# Patient Record
Sex: Female | Born: 1995 | Race: Black or African American | Hispanic: No | Smoking: Never smoker
Health system: Southern US, Community
[De-identification: ages and names within clinical notes are randomized; demographics above are authoritative.]

## PROBLEM LIST (undated history)

## (undated) HISTORY — PX: WISDOM TOOTH EXTRACTION: SHX21

---

## 2016-01-26 ENCOUNTER — Encounter (HOSPITAL_COMMUNITY): Payer: Self-pay

## 2016-01-26 ENCOUNTER — Ambulatory Visit (HOSPITAL_COMMUNITY)
Admission: EM | Admit: 2016-01-26 | Discharge: 2016-01-26 | Disposition: A | Discharge status: Home / Self Care | Payer: Medicaid Other | Attending: Family Medicine | Admitting: Family Medicine

## 2016-01-26 DIAGNOSIS — B349 Viral infection, unspecified: Secondary | ICD-10-CM

## 2016-01-26 DIAGNOSIS — J069 Acute upper respiratory infection, unspecified: Secondary | ICD-10-CM

## 2016-01-26 DIAGNOSIS — B9789 Other viral agents as the cause of diseases classified elsewhere: Secondary | ICD-10-CM | POA: Diagnosis not present

## 2016-01-26 NOTE — Discharge Instructions (Signed)
Sudafed PE 10 mg every 4 to 6 hours as needed for congestion °Allegra or Zyrtec daily as needed for drainage and runny nose. °For stronger antihistamine may take Chlor-Trimeton 2 to 4 mg every 4 to 6 hours, may cause drowsiness. °Saline nasal spray used frequently. °Ibuprofen 600 mg every 6 hours as needed for pain, discomfort or fever. °Drink plenty of fluids and stay well-hydrated. °

## 2016-01-26 NOTE — ED Triage Notes (Addendum)
[facility] for [facility] flu, symptoms consist of body aches, loss of appetite, chills, and runny nose x4 days, pt has been taking Thera-Flu, Vicks vapor rub, and dayquil, no relief

## 2016-01-26 NOTE — ED Provider Notes (Signed)
CSN: [id number]     Arrival date & time 01/26/16  1507 History   First MD Initiated Contact with Patient 01/26/16 1716     Chief Complaint  Patient presents with  . Influenza   (Consider location/radiation/quality/duration/timing/severity/associated sxs/prior Treatment) 21 year old female lives in a dorm at school complaining of a four-day history of runny nose, cough, subjective fever, body aches, chills, transient mild nausea and headache. She has been taking various OTC medications and has no relief. She is requesting a flu test. She is currently afebrile with temperature 98.7, temp 105 and vital signs otherwise normal.      History reviewed. No pertinent past medical history. History reviewed. No pertinent surgical history. History reviewed. No pertinent family history. Social History  Substance Use Topics  . Smoking status: Never Smoker  . Smokeless tobacco: Never Used  . Alcohol use No   OB History    Gravida Para Term Preterm AB Living   1             SAB TAB Ectopic Multiple Live Births                 Review of Systems  Constitutional: Positive for activity change, appetite change, fatigue and fever. Negative for chills.  HENT: Positive for congestion, postnasal drip and rhinorrhea. Negative for facial swelling, sore throat and trouble swallowing.   Eyes: Negative.   Respiratory: Positive for cough. Negative for shortness of breath.   Cardiovascular: Negative.   Gastrointestinal: Negative.   Musculoskeletal: Negative for neck pain and neck stiffness.  Skin: Negative for pallor and rash.  Neurological: Negative.     Allergies  Patient has no known allergies.  Home Medications   Prior to Admission medications   Not on File   Meds Ordered and Administered this Visit  Medications - No data to display  BP 116/78 (BP Location: Right Arm)   Pulse 105   Temp 98.7 F (37.1 C) (Oral)   Resp 18   LMP 01/07/2016 (Exact Date)   SpO2 98%  No data  found.   Physical Exam  Constitutional: She is oriented to person, place, and time. She appears well-developed and well-nourished. No distress.  HENT:  Head: Normocephalic and atraumatic.  Right Ear: External ear normal.  Left Ear: External ear normal.  Mouth/Throat: No oropharyngeal exudate.  Oropharynx with minor injection and clear PND otherwise normal. No exudate. Left TM with retraction. Right TM normal.   Eyes: EOM are normal. Pupils are equal, round, and reactive to light.  Neck: Normal range of motion. Neck supple.  Cardiovascular: Normal rate, regular rhythm, normal heart sounds and intact distal pulses.   Pulmonary/Chest: Effort normal and breath sounds normal. No respiratory distress. She has no wheezes. She has no rales.  Abdominal: Soft. There is no tenderness.  Musculoskeletal: Normal range of motion. She exhibits no edema.  Lymphadenopathy:    She has no cervical adenopathy.  Neurological: She is alert and oriented to person, place, and time.  Skin: Skin is warm and dry.  Psychiatric: She has a normal mood and affect.  Nursing note and vitals reviewed.   Urgent Care Course     Procedures (including critical care time)  Labs Review Labs Reviewed - No data to display  Imaging Review No results found.   Visual Acuity Review  Right Eye Distance:   Left Eye Distance:   Bilateral Distance:    Right Eye Near:   Left Eye Near:    Bilateral Near:  MDM   1. Viral illness   2. Viral upper respiratory tract infection    Sudafed PE 10 mg every 4 to 6 hours as needed for congestion Allegra or Zyrtec daily as needed for drainage and runny nose. For stronger antihistamine may take Chlor-Trimeton 2 to 4 mg every 4 to 6 hours, may cause drowsiness. Saline nasal spray used frequently. Ibuprofen 600 mg every 6 hours as needed for pain, discomfort or fever. Drink plenty of fluids and stay well-hydrated.    Hayden Rasmussenavid Courney Garrod, NP 01/26/16 1740

## 2017-01-08 DIAGNOSIS — R51 Headache: Secondary | ICD-10-CM | POA: Diagnosis not present

## 2017-01-13 DIAGNOSIS — K080 Exfoliation of teeth due to systemic causes: Secondary | ICD-10-CM | POA: Diagnosis not present

## 2017-01-14 DIAGNOSIS — K080 Exfoliation of teeth due to systemic causes: Secondary | ICD-10-CM | POA: Diagnosis not present

## 2017-02-22 ENCOUNTER — Other Ambulatory Visit: Payer: Self-pay

## 2017-02-22 ENCOUNTER — Ambulatory Visit (HOSPITAL_COMMUNITY)
Admission: EM | Admit: 2017-02-22 | Discharge: 2017-02-22 | Disposition: A | Discharge status: Home / Self Care | Payer: Medicaid Other | Attending: Internal Medicine | Admitting: Internal Medicine

## 2017-02-22 ENCOUNTER — Encounter (HOSPITAL_COMMUNITY): Payer: Self-pay | Admitting: *Deleted

## 2017-02-22 DIAGNOSIS — B349 Viral infection, unspecified: Secondary | ICD-10-CM | POA: Diagnosis not present

## 2017-02-22 MED ORDER — IPRATROPIUM BROMIDE 0.06 % NA SOLN
2.0000 | Freq: Four times a day (QID) | NASAL | 0 refills | Status: DC
Start: 2017-02-22 — End: 2018-09-22

## 2017-02-22 MED ORDER — PREDNISONE 50 MG PO TABS: 50.0000 mg | ORAL_TABLET | Freq: Every day | ORAL | 0 refills | Status: AC

## 2017-02-22 MED ORDER — FLUTICASONE PROPIONATE 50 MCG/ACT NA SUSP: 2.0000 | Freq: Every day | NASAL | 0 refills | Status: DC

## 2017-02-22 MED ORDER — OLOPATADINE HCL 0.2 % OP SOLN: 1.0000 [drp] | Freq: Every day | OPHTHALMIC | 0 refills | Status: DC

## 2017-02-22 NOTE — ED Triage Notes (Signed)
Chills, nasal congestion, facial pain, ear and headaches on right side.

## 2017-02-22 NOTE — ED Provider Notes (Signed)
[facility]    CSN: [id number] Arrival date & time: 02/22/17  1343     History   Chief Complaint Chief Complaint  Patient presents with  . Nasal Congestion  . Facial Pain  . Headache    right side  . Otalgia    right side    HPI Samantha Foster is a 22 y.o. female.   22 year old female comes in for 4-day history of URI symptoms.  Has had rhinorrhea, nasal congestion, throat irritation, facial pain, your pain, frontal headaches.  She is also had continued eye watering without itchiness, eye redness, sneezing.  Denies cough.  Subjective fever.  OTC cold medication without relief.  No obvious sick contact.  Never smoker.      History reviewed. No pertinent past medical history.  There are no active problems to display for this patient.   History reviewed. No pertinent surgical history.  OB History    Gravida Para Term Preterm AB Living   1             SAB TAB Ectopic Multiple Live Births                   Home Medications    Prior to Admission medications   Medication Sig Start Date End Date Taking? Authorizing Provider  guaifenesin (ROBITUSSIN) 100 MG/5ML syrup Take 200 mg by mouth 3 (three) times daily as needed for cough.   Yes [provider]  pseudoephedrine (SUDAFED) 30 MG tablet Take 30 mg by mouth every 4 (four) hours as needed for congestion.   Yes [provider]  fluticasone (FLONASE) 50 MCG/ACT nasal spray Place 2 sprays into both nostrils daily. 02/22/17   Cathie HoopsYu, Amy V, PA-C  ipratropium (ATROVENT) 0.06 % nasal spray Place 2 sprays into both nostrils 4 (four) times daily. 02/22/17   Cathie HoopsYu, Amy V, PA-C  Olopatadine HCl 0.2 % SOLN Apply 1 drop to eye daily. 02/22/17   Cathie HoopsYu, Amy V, PA-C  predniSONE (DELTASONE) 50 MG tablet Take 1 tablet (50 mg total) by mouth daily for 3 days. 02/22/17 02/25/17  Belinda FisherYu, Amy V, PA-C    Family History History reviewed. No pertinent family history.  Social History Social History   Tobacco Use  .  Smoking status: Never Smoker  . Smokeless tobacco: Never Used  Substance Use Topics  . Alcohol use: No  . Drug use: No     Allergies   Patient has no known allergies.   Review of Systems Review of Systems  Reason unable to perform ROS: See HPI as above.     Physical Exam Triage Vital Signs ED Triage Vitals  Enc Vitals Group     BP 02/22/17 1536 121/82     Pulse Rate 02/22/17 1536 93     Resp --      Temp 02/22/17 1536 98.4 F (36.9 C)     Temp Source 02/22/17 1536 Oral     SpO2 02/22/17 1536 100 %     Weight --      Height --      Head Circumference --      Peak Flow --      Pain Score 02/22/17 1538 8     Pain Loc --      Pain Edu? --      Excl. in GC? --    No data found.  Updated Vital Signs BP 121/82 (BP Location: Left Arm)   Pulse 93   Temp 98.4 F (36.9  C) (Oral)   LMP 02/19/2017 (Exact Date)   SpO2 100%   Breastfeeding? No   Physical Exam  Constitutional: She is oriented to person, place, and time. She appears well-developed and well-nourished. No distress.  HENT:  Head: Normocephalic and atraumatic.  Right Ear: External ear and ear canal normal. Tympanic membrane is erythematous. Tympanic membrane is not bulging.  Left Ear: External ear and ear canal normal. Tympanic membrane is erythematous. Tympanic membrane is not bulging.  Nose: Mucosal edema and rhinorrhea present. Right sinus exhibits maxillary sinus tenderness and frontal sinus tenderness. Left sinus exhibits maxillary sinus tenderness and frontal sinus tenderness.  Mouth/Throat: Uvula is midline, oropharynx is clear and moist and mucous membranes are normal.  Eyes: Conjunctivae are normal. Pupils are equal, round, and reactive to light.  Neck: Normal range of motion. Neck supple.  Cardiovascular: Normal rate, regular rhythm and normal heart sounds. Exam reveals no gallop and no friction rub.  No murmur heard. Pulmonary/Chest: Effort normal and breath sounds normal. She has no decreased  breath sounds. She has no wheezes. She has no rhonchi. She has no rales.  Lymphadenopathy:    She has no cervical adenopathy.  Neurological: She is alert and oriented to person, place, and time.  Skin: Skin is warm and dry.  Psychiatric: She has a normal mood and affect. Her behavior is normal. Judgment normal.     UC Treatments / Results  Labs (all labs ordered are listed, but only abnormal results are displayed) Labs Reviewed - No data to display  EKG  EKG Interpretation None       Radiology No results found.  Procedures Procedures (including critical care time)  Medications Ordered in UC Medications - No data to display   Initial Impression / Assessment and Plan / UC Course  I have reviewed the triage vital signs and the nursing notes.  Pertinent labs & imaging results that were available during my care of the patient were reviewed by me and considered in my medical decision making (see chart for details).    Discussed with patient history and exam most consistent with viral URI. Symptomatic treatment as needed. Push fluids. Return precautions given.   Final Clinical Impressions(s) / UC Diagnoses   Final diagnoses:  Viral illness    ED Discharge Orders        Ordered    fluticasone (FLONASE) 50 MCG/ACT nasal spray  Daily     02/22/17 1619    ipratropium (ATROVENT) 0.06 % nasal spray  4 times daily     02/22/17 1619    predniSONE (DELTASONE) 50 MG tablet  Daily     02/22/17 1619    Olopatadine HCl 0.2 % SOLN  Daily     02/22/17 1619        Belinda Fisher, PA-C 02/22/17 1830

## 2017-02-22 NOTE — Discharge Instructions (Signed)
Prednisone as directed for sinus pressure.  Start flonase, atrovent nasal spray for nasal congestion/drainage.  Continue Sudafed. you can use over the counter nasal saline rinse such as neti pot for nasal congestion. Keep hydrated, your urine should be clear to pale yellow in color.  You can take Pataday eyedrop to help with eye watering. Tylenol/motrin for fever and pain. Monitor for any worsening of symptoms, chest pain, shortness of breath, wheezing, swelling of the throat, follow up for reevaluation.   For sore throat try using a honey-based tea. Use 3 teaspoons of honey with juice squeezed from half lemon. Place shaved pieces of ginger into 1/2-1 cup of water and warm over stove top. Then mix the ingredients and repeat every 4 hours as needed.

## 2017-04-22 DIAGNOSIS — Z1159 Encounter for screening for other viral diseases: Secondary | ICD-10-CM | POA: Diagnosis not present

## 2017-04-22 DIAGNOSIS — Z113 Encounter for screening for infections with a predominantly sexual mode of transmission: Secondary | ICD-10-CM | POA: Diagnosis not present

## 2017-04-22 DIAGNOSIS — N762 Acute vulvitis: Secondary | ICD-10-CM | POA: Diagnosis not present

## 2017-04-28 DIAGNOSIS — N762 Acute vulvitis: Secondary | ICD-10-CM | POA: Diagnosis not present

## 2017-04-28 DIAGNOSIS — Z113 Encounter for screening for infections with a predominantly sexual mode of transmission: Secondary | ICD-10-CM | POA: Diagnosis not present

## 2017-05-27 DIAGNOSIS — B373 Candidiasis of vulva and vagina: Secondary | ICD-10-CM | POA: Diagnosis not present

## 2017-05-27 DIAGNOSIS — Z114 Encounter for screening for human immunodeficiency virus [HIV]: Secondary | ICD-10-CM | POA: Diagnosis not present

## 2017-05-27 DIAGNOSIS — N760 Acute vaginitis: Secondary | ICD-10-CM | POA: Diagnosis not present

## 2017-05-27 DIAGNOSIS — Z113 Encounter for screening for infections with a predominantly sexual mode of transmission: Secondary | ICD-10-CM | POA: Diagnosis not present

## 2017-06-11 DIAGNOSIS — K080 Exfoliation of teeth due to systemic causes: Secondary | ICD-10-CM | POA: Diagnosis not present

## 2017-07-01 DIAGNOSIS — K080 Exfoliation of teeth due to systemic causes: Secondary | ICD-10-CM | POA: Diagnosis not present

## 2017-07-28 DIAGNOSIS — K080 Exfoliation of teeth due to systemic causes: Secondary | ICD-10-CM | POA: Diagnosis not present

## 2017-08-07 DIAGNOSIS — K080 Exfoliation of teeth due to systemic causes: Secondary | ICD-10-CM | POA: Diagnosis not present

## 2017-08-13 DIAGNOSIS — K080 Exfoliation of teeth due to systemic causes: Secondary | ICD-10-CM | POA: Diagnosis not present

## 2017-11-25 DIAGNOSIS — B373 Candidiasis of vulva and vagina: Secondary | ICD-10-CM | POA: Diagnosis not present

## 2017-11-25 DIAGNOSIS — N760 Acute vaginitis: Secondary | ICD-10-CM | POA: Diagnosis not present

## 2018-03-16 ENCOUNTER — Encounter (HOSPITAL_COMMUNITY): Payer: Self-pay

## 2018-03-16 ENCOUNTER — Ambulatory Visit (HOSPITAL_COMMUNITY)
Admission: EM | Admit: 2018-03-16 | Discharge: 2018-03-16 | Disposition: A | Discharge status: Home / Self Care | Payer: Federal, State, Local not specified - PPO | Attending: Family Medicine | Admitting: Family Medicine

## 2018-03-16 DIAGNOSIS — K625 Hemorrhage of anus and rectum: Secondary | ICD-10-CM | POA: Diagnosis not present

## 2018-03-16 LAB — CBC WITH DIFFERENTIAL/PLATELET
Abs Immature Granulocytes: 0.03 10*3/uL (ref 0.00–0.07)
BASOS ABS: 0 10*3/uL (ref 0.0–0.1)
BASOS PCT: 0 %
EOS ABS: 0 10*3/uL (ref 0.0–0.5)
Eosinophils Relative: 0 %
HCT: 39.1 % (ref 36.0–46.0)
Hemoglobin: 13.3 g/dL (ref 12.0–15.0)
IMMATURE GRANULOCYTES: 0 %
Lymphocytes Relative: 29 %
Lymphs Abs: 2.1 10*3/uL (ref 0.7–4.0)
MCH: 29.2 pg (ref 26.0–34.0)
MCHC: 34 g/dL (ref 30.0–36.0)
MCV: 85.7 fL (ref 80.0–100.0)
Monocytes Absolute: 0.6 10*3/uL (ref 0.1–1.0)
Monocytes Relative: 9 %
NEUTROS PCT: 62 %
NRBC: 0 % (ref 0.0–0.2)
Neutro Abs: 4.5 10*3/uL (ref 1.7–7.7)
PLATELETS: 260 10*3/uL (ref 150–400)
RBC: 4.56 MIL/uL (ref 3.87–5.11)
RDW: 12.6 % (ref 11.5–15.5)
WBC: 7.2 10*3/uL (ref 4.0–10.5)

## 2018-03-16 LAB — OCCULT BLOOD, POC DEVICE: Fecal Occult Bld: NEGATIVE

## 2018-03-16 MED ORDER — ONDANSETRON HCL 4 MG PO TABS: 4.0000 mg | ORAL_TABLET | Freq: Four times a day (QID) | ORAL | 0 refills | Status: DC

## 2018-03-16 NOTE — ED Triage Notes (Signed)
Pt states she thinks she has food poisoning  from a brownie yesterday. C/o nausea and diarrhea

## 2018-03-16 NOTE — Discharge Instructions (Signed)
I will call you with the results from today  Please monitor your continued bowel movements  Please try to stay well hydrated.  Please seek immediate care if you have significant pain or develop fever.

## 2018-03-16 NOTE — ED Provider Notes (Signed)
[facility]    CSN: [id number] Arrival date & time: 03/16/18  1846     History   Chief Complaint Chief Complaint  Patient presents with  . Nausea    HPI Samantha Foster is a 23 y.o. female.   She is presenting with bright red blood per rectum that is painless.  This is been occurring since last night.  She has had about 3 movements.  The blood is a significant amount.  She denies any history of trauma.  No history of inflammatory bowel disease.  Denies any recent travel.  No fevers.  No history of similar symptoms.  Has not taken anything for this.  Has some nausea and lightheadedness associated with it.  HPI  History reviewed. No pertinent past medical history.  There are no active problems to display for this patient.   History reviewed. No pertinent surgical history.  OB History    Gravida  1   Para      Term      Preterm      AB      Living        SAB      TAB      Ectopic      Multiple      Live Births               Home Medications    Prior to Admission medications   Medication Sig Start Date End Date Taking? Authorizing Provider  fluticasone (FLONASE) 50 MCG/ACT nasal spray Place 2 sprays into both nostrils daily. 02/22/17   Cathie Hoops, Amy V, PA-C  guaifenesin (ROBITUSSIN) 100 MG/5ML syrup Take 200 mg by mouth 3 (three) times daily as needed for cough.    [provider]  ipratropium (ATROVENT) 0.06 % nasal spray Place 2 sprays into both nostrils 4 (four) times daily. 02/22/17   Cathie Hoops, Amy V, PA-C  Olopatadine HCl 0.2 % SOLN Apply 1 drop to eye daily. 02/22/17   Cathie Hoops, Amy V, PA-C  ondansetron (ZOFRAN) 4 MG tablet Take 1 tablet (4 mg total) by mouth every 6 (six) hours. 03/16/18   Myra Rude, MD  pseudoephedrine (SUDAFED) 30 MG tablet Take 30 mg by mouth every 4 (four) hours as needed for congestion.    [provider]    Family History No family history on file.  Social History Social History   Tobacco Use  .  Smoking status: Never Smoker  . Smokeless tobacco: Never Used  Substance Use Topics  . Alcohol use: No  . Drug use: No     Allergies   Patient has no known allergies.   Review of Systems Review of Systems  Constitutional: Negative for fever.  HENT: Negative for congestion.   Respiratory: Negative for cough.   Cardiovascular: Negative for chest pain.  Gastrointestinal: Positive for abdominal pain and nausea.  Musculoskeletal: Negative for gait problem.  Skin: Negative for color change.  Neurological: Negative for weakness.  Hematological: Negative for adenopathy.  Psychiatric/Behavioral: Negative for agitation.     Physical Exam Triage Vital Signs ED Triage Vitals  Enc Vitals Group     BP 03/16/18 1901 115/76     Pulse Rate 03/16/18 1901 93     Resp 03/16/18 1901 18     Temp 03/16/18 1901 98.4 F (36.9 C)     Temp Source 03/16/18 1901 Temporal     SpO2 03/16/18 1901 99 %     Weight --  Height --      Head Circumference --      Peak Flow --      Pain Score 03/16/18 1902 8     Pain Loc --      Pain Edu? --      Excl. in GC? --    No data found.  Updated Vital Signs BP 115/76 (BP Location: Right Arm)   Pulse 93   Temp 98.4 F (36.9 C) (Temporal)   Resp 18   LMP 03/06/2018   SpO2 99%   Visual Acuity Right Eye Distance:   Left Eye Distance:   Bilateral Distance:    Right Eye Near:   Left Eye Near:    Bilateral Near:     Physical Exam Gen: NAD, alert, cooperative with exam, well-appearing ENT: normal lips, normal nasal mucosa,  Eye: normal EOM, normal conjunctiva and lids CV:  no edema, +2 pedal pulses   Resp: no accessory muscle use, non-labored,  GI: no masses or tenderness, no hernia, positive bowel sounds, nondistended, Rectum: No hernia appreciated, normal external genitalia, no internal hemorrhoids appreciated, normal sphincter tone Skin: no rashes, no areas of induration  Neuro: normal tone, normal sensation to touch Psych:  normal  insight, alert and oriented MSK: Normal gait, normal strength   UC Treatments / Results  Labs (all labs ordered are listed, but only abnormal results are displayed) Labs Reviewed  CBC WITH DIFFERENTIAL/PLATELET  OCCULT BLOOD, POC DEVICE    EKG None  Radiology No results found.  Procedures Procedures (including critical care time)  Medications Ordered in UC Medications - No data to display  Initial Impression / Assessment and Plan / UC Course  I have reviewed the triage vital signs and the nursing notes.  Pertinent labs & imaging results that were available during my care of the patient were reviewed by me and considered in my medical decision making (see chart for details).     [name] is a 23 year old female is presenting with bright red blood per rectum.  This is painless and does not have any fever.  Likely related to hemorrhoids.  Less likely for inflammatory bowel disease or infection.  Will obtain a CBC.  Stool guaiac test was negative.  Given indications to follow-up and ongoing care.  May need colonoscopy if continues and further stool studies.  Final Clinical Impressions(s) / UC Diagnoses   Final diagnoses:  Bright red blood per rectum     Discharge Instructions     I will call you with the results from today  Please monitor your continued bowel movements  Please try to stay well hydrated.  Please seek immediate care if you have significant pain or develop fever.     ED Prescriptions    Medication Sig Dispense Auth. Provider   ondansetron (ZOFRAN) 4 MG tablet Take 1 tablet (4 mg total) by mouth every 6 (six) hours. 12 tablet Myra Rude, MD     Controlled Substance Prescriptions [place] Controlled Substance Registry consulted? Not Applicable   Myra Rude, MD 03/16/18 2141

## 2018-03-23 ENCOUNTER — Ambulatory Visit (INDEPENDENT_AMBULATORY_CARE_PROVIDER_SITE_OTHER): Payer: Federal, State, Local not specified - PPO | Admitting: Family Medicine

## 2018-03-23 ENCOUNTER — Other Ambulatory Visit: Payer: Self-pay

## 2018-03-23 ENCOUNTER — Encounter: Payer: Self-pay | Admitting: Family Medicine

## 2018-03-23 VITALS — BP 110/74 | HR 60 | Temp 98.0°F | Ht 61.0 in | Wt 136.2 lb

## 2018-03-23 DIAGNOSIS — Z3202 Encounter for pregnancy test, result negative: Secondary | ICD-10-CM

## 2018-03-23 DIAGNOSIS — R110 Nausea: Secondary | ICD-10-CM | POA: Insufficient documentation

## 2018-03-23 DIAGNOSIS — Z7689 Persons encountering health services in other specified circumstances: Secondary | ICD-10-CM

## 2018-03-23 DIAGNOSIS — R1012 Left upper quadrant pain: Secondary | ICD-10-CM | POA: Insufficient documentation

## 2018-03-23 DIAGNOSIS — R1084 Generalized abdominal pain: Secondary | ICD-10-CM | POA: Diagnosis not present

## 2018-03-23 DIAGNOSIS — K5900 Constipation, unspecified: Secondary | ICD-10-CM | POA: Insufficient documentation

## 2018-03-23 DIAGNOSIS — Z8719 Personal history of other diseases of the digestive system: Secondary | ICD-10-CM | POA: Insufficient documentation

## 2018-03-23 DIAGNOSIS — R197 Diarrhea, unspecified: Secondary | ICD-10-CM | POA: Insufficient documentation

## 2018-03-23 DIAGNOSIS — Z0000 Encounter for general adult medical examination without abnormal findings: Secondary | ICD-10-CM

## 2018-03-23 DIAGNOSIS — Z09 Encounter for follow-up examination after completed treatment for conditions other than malignant neoplasm: Secondary | ICD-10-CM

## 2018-03-23 LAB — POCT GLYCOSYLATED HEMOGLOBIN (HGB A1C): Hemoglobin A1C: 5 % (ref 4.0–5.6)

## 2018-03-23 LAB — POCT URINALYSIS DIP (MANUAL ENTRY)
Blood, UA: NEGATIVE
Glucose, UA: NEGATIVE mg/dL
Leukocytes, UA: NEGATIVE
Nitrite, UA: NEGATIVE
Protein Ur, POC: 30 mg/dL — AB
Spec Grav, UA: >=1.03 — AB (ref 1.010–1.025)
Urobilinogen, UA: 1 E.U./dL
pH, UA: 6 (ref 5.0–8.0)

## 2018-03-23 LAB — POCT URINE PREGNANCY: Preg Test, Ur: NEGATIVE

## 2018-03-23 MED ORDER — ONDANSETRON HCL 4 MG PO TABS: 4.0000 mg | ORAL_TABLET | Freq: Four times a day (QID) | ORAL | 1 refills | Status: DC

## 2018-03-23 MED ORDER — POLYETHYLENE GLYCOL 3350 17 GM/SCOOP PO POWD: 17.0000 g | Freq: Every day | ORAL | 1 refills | Status: DC

## 2018-03-23 MED ORDER — DOCUSATE SODIUM 100 MG PO CAPS: 100.0000 mg | ORAL_CAPSULE | Freq: Two times a day (BID) | ORAL | 0 refills | Status: DC

## 2018-03-23 NOTE — Patient Instructions (Signed)
Diarrhea, Adult Diarrhea is when you pass loose and watery poop (stool) often. Diarrhea can make you feel weak and cause you to lose water in your body (get dehydrated). Losing water in your body can cause you to:  Feel tired and thirsty.  Have a dry mouth.  Go pee (urinate) less often. Diarrhea often lasts 2-3 days. However, it can last longer if it is a sign of something more serious. It is important to treat your diarrhea as told by your doctor. Follow these instructions at home: Eating and drinking     Follow these instructions as told by your doctor:  Take an ORS (oral rehydration solution). This is a drink that helps you replace fluids and minerals your body lost. It is sold at pharmacies and stores.  Drink plenty of fluids, such as: ? Water. ? Ice chips. ? Diluted fruit juice. ? Low-calorie sports drinks. ? Milk, if you want.  Avoid drinking fluids that have a lot of sugar or caffeine in them.  Eat bland, easy-to-digest foods in small amounts as you are able. These foods include: ? Bananas. ? Applesauce. ? Rice. ? Low-fat (lean) meats. ? Toast. ? Crackers.  Avoid alcohol.  Avoid spicy or fatty foods.  Medicines  Take over-the-counter and prescription medicines only as told by your doctor.  If you were prescribed an antibiotic medicine, take it as told by your doctor. Do not stop using the antibiotic even if you start to feel better. General instructions   Wash your hands often using soap and water. If soap and water are not available, use a hand sanitizer. Others in your home should wash their hands as well. Hands should be washed: ? After using the toilet or changing a diaper. ? Before preparing, cooking, or serving food. ? While caring for a sick person. ? While visiting someone in a hospital.  Drink enough fluid to keep your pee (urine) pale yellow.  Rest at home while you get better.  Watch your condition for any changes.  Take a warm bath to help  with any burning or pain from having diarrhea.  Keep all follow-up visits as told by your doctor. This is important. Contact a doctor if:  You have a fever.  Your diarrhea gets worse.  You have new symptoms.  You cannot keep fluids down.  You feel light-headed or dizzy.  You have a headache.  You have muscle cramps. Get help right away if:  You have chest pain.  You feel very weak or you pass out (faint).  You have bloody or black poop or poop that looks like tar.  You have very bad pain, cramping, or bloating in your belly (abdomen).  You have trouble breathing or you are breathing very quickly.  Your heart is beating very quickly.  Your skin feels cold and clammy.  You feel confused.  You have signs of losing too much water in your body, such as: ? Dark pee, very little pee, or no pee. ? Cracked lips. ? Dry mouth. ? Sunken eyes. ? Sleepiness. ? Weakness. Summary  Diarrhea is when you pass loose and watery poop (stool) often.  Diarrhea can make you feel weak and cause you to lose water in your body (get dehydrated).  Take an ORS (oral rehydration solution). This is a drink that is sold at pharmacies and stores.  Eat bland, easy-to-digest foods in small amounts as you are able.  Contact a doctor if your condition gets worse. Get help right   away if you have signs that you have lost too much water in your body. This information is not intended to replace advice given to you by your health care provider. Make sure you discuss any questions you have with your health care provider. Document Released: 06/11/2007 Document Revised: 05/29/2017 Document Reviewed: 05/29/2017 Elsevier Interactive Patient Education  2019 Elsevier Inc.  Ondansetron tablets What is this medicine? ONDANSETRON (on DAN se tron) is used to treat nausea and vomiting caused by chemotherapy. It is also used to prevent or treat nausea and vomiting after surgery. This medicine may be used for  other purposes; ask your health care provider or pharmacist if you have questions. COMMON BRAND NAME(S): Zofran What should I tell my health care provider before I take this medicine? They need to know if you have any of these conditions: -heart disease -history of irregular heartbeat -liver disease -low levels of magnesium or potassium in the blood -an unusual or allergic reaction to ondansetron, granisetron, other medicines, foods, dyes, or preservatives -pregnant or trying to get pregnant -breast-feeding How should I use this medicine? Take this medicine by mouth with a glass of water. Follow the directions on your prescription label. Take your doses at regular intervals. Do not take your medicine more often than directed. Talk to your pediatrician regarding the use of this medicine in children. Special care may be needed. Overdosage: If you think you have taken too much of this medicine contact a poison control center or emergency room at once. NOTE: This medicine is only for you. Do not share this medicine with others. What if I miss a dose? If you miss a dose, take it as soon as you can. If it is almost time for your next dose, take only that dose. Do not take double or extra doses. What may interact with this medicine? Do not take this medicine with any of the following medications: -apomorphine -certain medicines for fungal infections like fluconazole, itraconazole, ketoconazole, posaconazole, voriconazole -cisapride -dofetilide -dronedarone -pimozide -thioridazine -ziprasidone This medicine may also interact with the following medications: -carbamazepine -certain medicines for depression, anxiety, or psychotic disturbances -fentanyl -linezolid -MAOIs like Carbex, Eldepryl, Marplan, Nardil, and Parnate -methylene blue (injected into a vein) -other medicines that prolong the QT interval (cause an abnormal heart rhythm) -phenytoin -rifampicin -tramadol This list may not  describe all possible interactions. Give your health care provider a list of all the medicines, herbs, non-prescription drugs, or dietary supplements you use. Also tell them if you smoke, drink alcohol, or use illegal drugs. Some items may interact with your medicine. What should I watch for while using this medicine? Check with your doctor or health care professional right away if you have any sign of an allergic reaction. What side effects may I notice from receiving this medicine? Side effects that you should report to your doctor or health care professional as soon as possible: -allergic reactions like skin rash, itching or hives, swelling of the face, lips or tongue -breathing problems -confusion -dizziness -fast or irregular heartbeat -feeling faint or lightheaded, falls -fever and chills -loss of balance or coordination -seizures -sweating -swelling of the hands or feet -tightness in the chest -tremors -unusually weak or tired Side effects that usually do not require medical attention (report to your doctor or health care professional if they continue or are bothersome): -constipation or diarrhea -headache This list may not describe all possible side effects. Call your doctor for medical advice about side effects. You may report side effects  to FDA at 1-800-FDA-1088. Where should I keep my medicine? Keep out of the reach of children. Store between 2 and 30 degrees C (36 and 86 degrees F). Throw away any unused medicine after the expiration date. NOTE: This sheet is a summary. It may not cover all possible information. If you have questions about this medicine, talk to your doctor, pharmacist, or health care provider.  2019 Elsevier/Gold Standard (2012-09-29 16:27:45)  Constipation, Adult Constipation is when a person:  Poops (has a bowel movement) fewer times in a week than normal.  Has a hard time pooping.  Has poop that is dry, hard, or bigger than normal. Follow these  instructions at home: Eating and drinking   Eat foods that have a lot of fiber, such as: ? Fresh fruits and vegetables. ? Whole grains. ? Beans.  Eat less of foods that are high in fat, low in fiber, or overly processed, such as: ? [place] fries. ? Hamburgers. ? Cookies. ? Candy. ? Soda.  Drink enough fluid to keep your pee (urine) clear or pale yellow. General instructions  Exercise regularly or as told by your doctor.  Go to the restroom when you feel like you need to poop. Do not hold it in.  Take over-the-counter and prescription medicines only as told by your doctor. These include any fiber supplements.  Do pelvic floor retraining exercises, such as: ? Doing deep breathing while relaxing your lower belly (abdomen). ? Relaxing your pelvic floor while pooping.  Watch your condition for any changes.  Keep all follow-up visits as told by your doctor. This is important. Contact a doctor if:  You have pain that gets worse.  You have a fever.  You have not pooped for 4 days.  You throw up (vomit).  You are not hungry.  You lose weight.  You are bleeding from the anus.  You have thin, pencil-like poop (stool). Get help right away if:  You have a fever, and your symptoms suddenly get worse.  You leak poop or have blood in your poop.  Your belly feels hard or bigger than normal (is bloated).  You have very bad belly pain.  You feel dizzy or you faint. This information is not intended to replace advice given to you by your health care provider. Make sure you discuss any questions you have with your health care provider. Document Released: 06/11/2007 Document Revised: 07/13/2015 Document Reviewed: 06/13/2015 Elsevier Interactive Patient Education  2019 Elsevier Inc.  Nausea, Adult Nausea is feeling sick to your stomach or feeling that you are about to throw up (vomit). Feeling sick to your stomach is usually not serious, but it may be an early sign of a more  serious medical problem. As you feel sicker to your stomach, you may throw up. If you throw up, or if you are not able to drink enough fluids, there is a risk that you may lose too much water in your body (get dehydrated). If you lose too much water in your body, you may:  Feel tired.  Feel thirsty.  Have a dry mouth.  Have cracked lips.  Go pee (urinate) less often. Older adults and people who have other diseases or a weak body defense system (immune system) have a higher risk of losing too much water in the body. The main goals of treating this condition are:  To relieve your nausea.  To ensure your nausea occurs less often.  To prevent throwing up and losing too much fluid. Follow  these instructions at home: Watch your symptoms for any changes. Tell your doctor about them. Follow these instructions as told by your doctor. Eating and drinking      Take an ORS (oral rehydration solution). This is a drink that is sold at pharmacies and stores.  Drink clear fluids in small amounts as you are able. These include: ? Water. ? Ice chips. ? Fruit juice that has water added (diluted fruit juice). ? Low-calorie sports drinks.  Eat bland, easy-to-digest foods in small amounts as you are able, such as: ? Bananas. ? Applesauce. ? Rice. ? Low-fat (lean) meats. ? Toast. ? Crackers.  Avoid drinking fluids that have a lot of sugar or caffeine in them. This includes energy drinks, sports drinks, and soda.  Avoid alcohol.  Avoid spicy or fatty foods. General instructions  Take over-the-counter and prescription medicines only as told by your doctor.  Rest at home while you get better.  Drink enough fluid to keep your pee (urine) pale yellow.  Take slow and deep breaths when you feel sick to your stomach.  Avoid food or things that have strong smells.  Wash your hands often with soap and water. If you cannot use soap and water, use hand sanitizer.  Make sure that all people  in your home wash their hands well and often.  Keep all follow-up visits as told by your doctor. This is important. Contact a doctor if:  You feel sicker to your stomach.  You feel sick to your stomach for more than 2 days.  You throw up.  You are not able to drink fluids without throwing up.  You have new symptoms.  You have a fever.  You have a headache.  You have muscle cramps.  You have a rash.  You have pain while peeing.  You feel light-headed or dizzy. Get help right away if:  You have pain in your chest, neck, arm, or jaw.  You feel very weak or you pass out (faint).  You have throw up that is bright red or looks like coffee grounds.  You have bloody or black poop (stools) or poop that looks like tar.  You have a very bad headache, a stiff neck, or both.  You have very bad pain, cramping, or bloating in your belly (abdomen).  You have trouble breathing or you are breathing very quickly.  Your heart is beating very quickly.  Your skin feels cold and clammy.  You feel confused.  You have signs of losing too much water in your body, such as: ? Dark pee, very little pee, or no pee. ? Cracked lips. ? Dry mouth. ? Sunken eyes. ? Sleepiness. ? Weakness. These symptoms may be an emergency. Do not wait to see if the symptoms will go away. Get medical help right away. Call your local emergency services (911 in the [place] Do not drive yourself to the hospital. Summary  Nausea is feeling sick to your stomach or feeling that you are about to throw up (vomit).  If you throw up, or if you are not able to drink enough fluids, there is a risk that you may lose too much water in your body (get dehydrated).  Eat and drink what your doctor tells you. Take over-the-counter and prescription medicines only as told by your doctor.  Contact a doctor right away if your symptoms get worse or you have new symptoms.  Keep all follow-up visits as told by your doctor. This  is important. This  information is not intended to replace advice given to you by your health care provider. Make sure you discuss any questions you have with your health care provider. Document Released: 12/12/2010 Document Revised: 06/02/2017 Document Reviewed: 06/02/2017 Elsevier Interactive Patient Education  2019 [employer].

## 2018-03-23 NOTE — Progress Notes (Signed)
[facility]   New Patient--Establish Care  Subjective:  Patient ID: Samantha Foster, female    DOB: 1995-06-28  Age: 23 y.o. MRN: [id number]  CC:  Chief Complaint  Patient presents with  . Establish Care  . Abdominal Cramping  . Constipation    HPI Samantha Foster is a 23 year old female who presents for Establish Care today.   No past medical history on file.   Current Status: Since her last Urgent Care Visit on 03/16/2018 for rectal bleeding, she is doing well with no complaints. She has not had a bowel movement since then. She reports intense left upper quadrant and generalized abdominal pain upon eating X 2 weeks. She states that she has not eaten any foods that are not part of her usual eating habits. She reports both constipation, diarrhea and nausea.  No reports of GI problems such as nausea, vomiting, diarrhea, and constipation. She has no reports of blood in stools, dysuria and hematuria.   She denies fevers, chills, fatigue, recent infections, weight loss, and night sweats. She has not had any headaches, visual changes, dizziness, and falls. No chest pain, heart palpitations, cough and shortness of breath reported.No depression or anxiety, and denies suicidal ideations, homicidal ideations, or auditory hallucinations. She denies pain today.   No past surgical history on file.  Family History  Problem Relation Age of Onset  . Other Mother        Healthy  . Other Father        Healthy     Social History   Socioeconomic History  . Marital status: Significant Other    Spouse name: Not on file  . Number of children: Not on file  . Years of education: Not on file  . Highest education level: Not on file  Occupational History  . Not on file  Social Needs  . Financial resource strain: Not on file  . Food insecurity:    Worry: Not on file    Inability: Not on file  . Transportation needs:    Medical: Not on file     Non-medical: Not on file  Tobacco Use  . Smoking status: Never Smoker  . Smokeless tobacco: Never Used  Substance and Sexual Activity  . Alcohol use: Yes  . Drug use: No  . Sexual activity: Never  Lifestyle  . Physical activity:    Days per week: Not on file    Minutes per session: Not on file  . Stress: Not on file  Relationships  . Social connections:    Talks on phone: Not on file    Gets together: Not on file    Attends religious service: Not on file    Active member of club or organization: Not on file    Attends meetings of clubs or organizations: Not on file    Relationship status: Not on file  . Intimate partner violence:    Fear of current or ex partner: Not on file    Emotionally abused: Not on file    Physically abused: Not on file    Forced sexual activity: Not on file  Other Topics Concern  . Not on file  Social History Narrative  . Not on file    Outpatient Medications Prior to Visit  Medication Sig Dispense Refill  . ipratropium (ATROVENT) 0.06 % nasal spray Place 2 sprays into both nostrils 4 (four) times daily. (Patient not taking: Reported on 03/23/2018) 15 mL  0  . fluticasone (FLONASE) 50 MCG/ACT nasal spray Place 2 sprays into both nostrils daily. (Patient not taking: Reported on 03/23/2018) 1 g 0  . guaifenesin (ROBITUSSIN) 100 MG/5ML syrup Take 200 mg by mouth 3 (three) times daily as needed for cough.    . Olopatadine HCl 0.2 % SOLN Apply 1 drop to eye daily. (Patient not taking: Reported on 03/23/2018) 2.5 mL 0  . ondansetron (ZOFRAN) 4 MG tablet Take 1 tablet (4 mg total) by mouth every 6 (six) hours. (Patient not taking: Reported on 03/23/2018) 12 tablet 0  . pseudoephedrine (SUDAFED) 30 MG tablet Take 30 mg by mouth every 4 (four) hours as needed for congestion.     No facility-administered medications prior to visit.     No Known Allergies  ROS Review of Systems  Constitutional: Negative.   Eyes: Negative.   Respiratory: Negative.    Cardiovascular: Negative.   Gastrointestinal: Negative.   Endocrine: Negative.   Genitourinary: Negative.   Musculoskeletal: Negative.   Skin: Negative.   Allergic/Immunologic: Negative.   Neurological: Negative.   Hematological: Negative.   Psychiatric/Behavioral: Negative.     Objective:   Physical Exam  Constitutional: She is oriented to person, place, and time. She appears well-developed and well-nourished.  HENT:  Head: Normocephalic and atraumatic.  Eyes: Conjunctivae are normal.  Neck: Normal range of motion. Neck supple.  Cardiovascular: Normal rate, regular rhythm, normal heart sounds and intact distal pulses.  Pulmonary/Chest: Effort normal and breath sounds normal.  Abdominal: Bowel sounds are normal.  Musculoskeletal: Normal range of motion.  Neurological: She is alert and oriented to person, place, and time. She has normal reflexes.  Skin: Skin is warm and dry.  Psychiatric: She has a normal mood and affect. Her behavior is normal. Judgment and thought content normal.  Nursing note and vitals reviewed.   BP 110/74 (BP Location: Left Arm, Patient Position: Sitting, Cuff Size: Small)   Pulse 60   Temp 98 F (36.7 C) (Oral)   Ht '5\' 1"'  (1.549 m)   Wt 136 lb 3.2 oz (61.8 kg)   LMP 03/06/2018   SpO2 100%   BMI 25.73 kg/m  Wt Readings from Last 3 Encounters:  03/23/18 136 lb 3.2 oz (61.8 kg)   Health Maintenance Due  Topic Date Due  . HIV Screening  12/03/2011  . PAP-Cervical Cytology Screening  12/02/2017  . PAP SMEAR-Modifier  12/02/2017    There are no preventive care reminders to display for this patient.  No results found for: TSH Lab Results  Component Value Date   WBC 7.2 03/16/2018   HGB 13.3 03/16/2018   HCT 39.1 03/16/2018   MCV 85.7 03/16/2018   PLT 260 03/16/2018   No results found for: NA, K, CHLORIDE, CO2, GLUCOSE, BUN, CREATININE, BILITOT, ALKPHOS, AST, ALT, PROT, ALBUMIN, CALCIUM, ANIONGAP, EGFR, GFR No results found for: CHOL No  results found for: HDL No results found for: LDLCALC No results found for: TRIG No results found for: [facility] Lab Results  Component Value Date   HGBA1C 5.0 03/23/2018      Assessment & Plan:   1. Encounter to establish care - POCT glycosylated hemoglobin (Hb A1C) - POCT urinalysis dipstick - POCT urine pregnancy  2. History of rectal bleeding - Ambulatory referral to Gastroenterology - US Abdomen Complete; Future  3. Left upper quadrant pain - US Abdomen Complete; Future  4. Generalized abdominal pain - POCT glycosylated hemoglobin (Hb A1C) - POCT urinalysis dipstick - POCT urine pregnancy -  Ambulatory referral to Gastroenterology - US Abdomen Complete; Future  5. Nausea We will initiate Zofran today.  - ondansetron (ZOFRAN) 4 MG tablet; Take 1 tablet (4 mg total) by mouth every 6 (six) hours.  Dispense: 20 tablet; Refill: 1 - US Abdomen Complete; Future  6. Diarrhea, unspecified type  7. Constipation, unspecified constipation type - US Abdomen Complete; Future - docusate sodium (COLACE) 100 MG capsule; Take 1 capsule (100 mg total) by mouth 2 (two) times daily.  Dispense: 10 capsule; Refill: 0 - polyethylene glycol powder (GLYCOLAX/MIRALAX) powder; Take 17 g by mouth daily.  Dispense: 3350 g; Refill: 1  8. Healthcare maintenance  9. Follow up She will follow up in 2 months.   Problem List Items Addressed This Visit    None    Visit Diagnoses    Encounter to establish care    -  Primary   Relevant Orders   POCT glycosylated hemoglobin (Hb A1C) (Completed)   POCT urinalysis dipstick (Completed)   POCT urine pregnancy (Completed)   History of rectal bleeding       Relevant Orders   Ambulatory referral to Gastroenterology   US Abdomen Complete   Left upper quadrant pain       Relevant Orders   US Abdomen Complete   Generalized abdominal pain       Relevant Orders   POCT glycosylated hemoglobin (Hb A1C) (Completed)   POCT urinalysis dipstick (Completed)    POCT urine pregnancy (Completed)   Ambulatory referral to Gastroenterology   US Abdomen Complete   Nausea       Relevant Medications   ondansetron (ZOFRAN) 4 MG tablet   Other Relevant Orders   US Abdomen Complete   Diarrhea, unspecified type       Constipation, unspecified constipation type       Relevant Medications   docusate sodium (COLACE) 100 MG capsule   polyethylene glycol powder (GLYCOLAX/MIRALAX) powder   Other Relevant Orders   US Abdomen Complete   Healthcare maintenance       Follow up          Meds ordered this encounter  Medications  . ondansetron (ZOFRAN) 4 MG tablet    Sig: Take 1 tablet (4 mg total) by mouth every 6 (six) hours.    Dispense:  20 tablet    Refill:  1  . docusate sodium (COLACE) 100 MG capsule    Sig: Take 1 capsule (100 mg total) by mouth 2 (two) times daily.    Dispense:  10 capsule    Refill:  0  . polyethylene glycol powder (GLYCOLAX/MIRALAX) powder    Sig: Take 17 g by mouth daily.    Dispense:  3350 g    Refill:  1    Follow-up: Return in about 2 months (around 05/23/2018).    Azzie Glatter, FNP

## 2018-03-24 LAB — COMPREHENSIVE METABOLIC PANEL
ALT: 8 IU/L (ref 0–32)
AST: 18 IU/L (ref 0–40)
Albumin/Globulin Ratio: 1.8 (ref 1.2–2.2)
Albumin: 4.5 g/dL (ref 3.9–5.0)
Alkaline Phosphatase: 69 IU/L (ref 39–117)
BUN/Creatinine Ratio: 6 — ABNORMAL LOW (ref 9–23)
BUN: 5 mg/dL — ABNORMAL LOW (ref 6–20)
Bilirubin Total: 0.4 mg/dL (ref 0.0–1.2)
CO2: 23 mmol/L (ref 20–29)
Calcium: 9.4 mg/dL (ref 8.7–10.2)
Chloride: 103 mmol/L (ref 96–106)
Creatinine, Ser: 0.8 mg/dL (ref 0.57–1.00)
GFR calc Af Amer: 122 mL/min/{1.73_m2} (ref >59)
GFR calc non Af Amer: 106 mL/min/{1.73_m2} (ref >59)
Globulin, Total: 2.5 g/dL (ref 1.5–4.5)
Glucose: 86 mg/dL (ref 65–99)
Potassium: 4.1 mmol/L (ref 3.5–5.2)
Sodium: 140 mmol/L (ref 134–144)
Total Protein: 7 g/dL (ref 6.0–8.5)

## 2018-03-24 LAB — CBC WITH DIFFERENTIAL/PLATELET
Basophils Absolute: 0 10*3/uL (ref 0.0–0.2)
Basos: 1 %
EOS (ABSOLUTE): 0.1 10*3/uL (ref 0.0–0.4)
Eos: 2 %
Hematocrit: 35.5 % (ref 34.0–46.6)
Hemoglobin: 12.3 g/dL (ref 11.1–15.9)
Immature Grans (Abs): 0 10*3/uL (ref 0.0–0.1)
Immature Granulocytes: 0 %
Lymphocytes Absolute: 2.1 10*3/uL (ref 0.7–3.1)
Lymphs: 32 %
MCH: 29.3 pg (ref 26.6–33.0)
MCHC: 34.6 g/dL (ref 31.5–35.7)
MCV: 85 fL (ref 79–97)
Monocytes Absolute: 0.6 10*3/uL (ref 0.1–0.9)
Monocytes: 8 %
Neutrophils Absolute: 3.9 10*3/uL (ref 1.4–7.0)
Neutrophils: 57 %
Platelets: 289 10*3/uL (ref 150–450)
RBC: 4.2 x10E6/uL (ref 3.77–5.28)
RDW: 13 % (ref 11.7–15.4)
WBC: 6.8 10*3/uL (ref 3.4–10.8)

## 2018-03-31 ENCOUNTER — Telehealth: Payer: Self-pay

## 2018-03-31 NOTE — Telephone Encounter (Signed)
Patient will have US done at [facility] imaging in [place]. Order has been faxed

## 2018-04-08 ENCOUNTER — Other Ambulatory Visit: Payer: Self-pay | Admitting: Family Medicine

## 2018-04-08 DIAGNOSIS — R1012 Left upper quadrant pain: Secondary | ICD-10-CM | POA: Diagnosis not present

## 2018-04-08 DIAGNOSIS — K5900 Constipation, unspecified: Secondary | ICD-10-CM | POA: Diagnosis not present

## 2018-04-08 DIAGNOSIS — R1084 Generalized abdominal pain: Secondary | ICD-10-CM

## 2018-04-08 DIAGNOSIS — R110 Nausea: Secondary | ICD-10-CM | POA: Diagnosis not present

## 2018-04-08 NOTE — Progress Notes (Signed)
Imaging office called and needs order faxed to them for [place] abd complete. Fax to [phone]

## 2018-04-12 ENCOUNTER — Telehealth: Payer: Self-pay

## 2018-04-12 NOTE — Telephone Encounter (Signed)
Patient advise that [place] was normal

## 2018-04-26 ENCOUNTER — Ambulatory Visit (HOSPITAL_COMMUNITY): Payer: Federal, State, Local not specified - PPO

## 2018-05-24 ENCOUNTER — Ambulatory Visit: Payer: Federal, State, Local not specified - PPO | Admitting: Family Medicine

## 2018-06-09 ENCOUNTER — Encounter (HOSPITAL_COMMUNITY): Payer: Self-pay | Admitting: Family Medicine

## 2018-08-09 DIAGNOSIS — N760 Acute vaginitis: Secondary | ICD-10-CM | POA: Diagnosis not present

## 2018-08-09 DIAGNOSIS — Z202 Contact with and (suspected) exposure to infections with a predominantly sexual mode of transmission: Secondary | ICD-10-CM | POA: Diagnosis not present

## 2018-08-09 DIAGNOSIS — N390 Urinary tract infection, site not specified: Secondary | ICD-10-CM | POA: Diagnosis not present

## 2018-08-10 ENCOUNTER — Ambulatory Visit: Payer: Federal, State, Local not specified - PPO | Admitting: Family Medicine

## 2018-08-24 DIAGNOSIS — N7689 Other specified inflammation of vagina and vulva: Secondary | ICD-10-CM | POA: Diagnosis not present

## 2018-08-24 DIAGNOSIS — N760 Acute vaginitis: Secondary | ICD-10-CM | POA: Diagnosis not present

## 2018-09-01 ENCOUNTER — Other Ambulatory Visit: Payer: Self-pay

## 2018-09-01 ENCOUNTER — Encounter: Payer: Self-pay | Admitting: Family Medicine

## 2018-09-01 ENCOUNTER — Ambulatory Visit (INDEPENDENT_AMBULATORY_CARE_PROVIDER_SITE_OTHER): Payer: Federal, State, Local not specified - PPO | Admitting: Family Medicine

## 2018-09-01 VITALS — BP 102/68 | HR 76 | Temp 98.6°F | Ht 61.0 in | Wt 131.8 lb

## 2018-09-01 DIAGNOSIS — Z09 Encounter for follow-up examination after completed treatment for conditions other than malignant neoplasm: Secondary | ICD-10-CM

## 2018-09-01 DIAGNOSIS — Z0000 Encounter for general adult medical examination without abnormal findings: Secondary | ICD-10-CM

## 2018-09-01 DIAGNOSIS — K5900 Constipation, unspecified: Secondary | ICD-10-CM

## 2018-09-01 DIAGNOSIS — Z3202 Encounter for pregnancy test, result negative: Secondary | ICD-10-CM

## 2018-09-01 DIAGNOSIS — Z789 Other specified health status: Secondary | ICD-10-CM | POA: Insufficient documentation

## 2018-09-01 DIAGNOSIS — R110 Nausea: Secondary | ICD-10-CM

## 2018-09-01 LAB — POCT URINALYSIS DIPSTICK
Bilirubin, UA: NEGATIVE
Blood, UA: NEGATIVE
Glucose, UA: NEGATIVE
Ketones, UA: NEGATIVE
Leukocytes, UA: NEGATIVE
Nitrite, UA: NEGATIVE
Protein, UA: POSITIVE — AB
Spec Grav, UA: >=1.03 — AB (ref 1.010–1.025)
Urobilinogen, UA: 0.2 E.U./dL
pH, UA: 5.5 (ref 5.0–8.0)

## 2018-09-01 LAB — POCT URINE PREGNANCY: Preg Test, Ur: NEGATIVE

## 2018-09-01 MED ORDER — LO LOESTRIN FE 1 MG-10 MCG / 10 MCG PO TABS: 1.0000 | ORAL_TABLET | Freq: Every day | ORAL | 11 refills | Status: DC

## 2018-09-01 NOTE — Patient Instructions (Addendum)
Contraception Choices Contraception, also called birth control, means things to use or ways to try not to get pregnant. Hormonal birth control This kind of birth control uses hormones. Here are some types of hormonal birth control:  A tube that is put under skin of the arm (implant). The tube can stay in for as long as 3 years.  Shots to get every 3 months (injections).  Pills to take every day (birth control pills).  A patch to change 1 time each week for 3 weeks (birth control patch). After that, the patch is taken off for 1 week.  A ring to put in the vagina. The ring is left in for 3 weeks. Then it is taken out of the vagina for 1 week. Then a new ring is put in.  Pills to take after unprotected sex (emergency birth control pills). Barrier birth control Here are some types of barrier birth control:  A thin covering that is put on the penis before sex (female condom). The covering is thrown away after sex.  A soft, loose covering that is put in the vagina before sex (female condom). The covering is thrown away after sex.  A rubber bowl that sits over the cervix (diaphragm). The bowl must be made for you. The bowl is put into the vagina before sex. The bowl is left in for 6-8 hours after sex. It is taken out within 24 hours.  A small, soft cup that fits over the cervix (cervical cap). The cup must be made for you. The cup can be left in for 6-8 hours after sex. It is taken out within 48 hours.  A sponge that is put into the vagina before sex. It must be left in for at least 6 hours after sex. It must be taken out within 30 hours. Then it is thrown away.  A chemical that kills or stops sperm from getting into the uterus (spermicide). It may be a pill, cream, jelly, or foam to put in the vagina. The chemical should be used at least 10-15 minutes before sex. IUD (intrauterine) birth control An IUD is a small, T-shaped piece of plastic. It is put inside the uterus. There are two kinds:   Hormone IUD. This kind can stay in for 3-5 years.  Copper IUD. This kind can stay in for 10 years. Permanent birth control Here are some types of permanent birth control:  Surgery to block the fallopian tubes.  Having an insert put into each fallopian tube.  Surgery to tie off the tubes that carry sperm (vasectomy). Natural planning birth control Here are some types of natural planning birth control:  Not having sex on the days the woman could get pregnant.  Using a calendar: ? To keep track of the length of each period. ? To find out what days pregnancy can happen. ? To plan to not have sex on days when pregnancy can happen.  Watching for symptoms of ovulation and not having sex during ovulation. One way the woman can check for ovulation is to check her temperature.  Waiting to have sex until after ovulation. Summary  Contraception, also called birth control, means things to use or ways to try not to get pregnant.  Hormonal methods of birth control include implants, injections, pills, patches, vaginal rings, and emergency birth control pills.  Barrier methods of birth control can include female condoms, female condoms, diaphragms, cervical caps, sponges, and spermicides.  There are two types of IUD (intrauterine device) birth control.  An IUD can be put in a woman's uterus to prevent pregnancy for 3-5 years.  Permanent sterilization can be done through a procedure for males, females, or both.  Natural planning methods involve not having sex on the days when the woman could get pregnant. This information is not intended to replace advice given to you by your health care provider. Make sure you discuss any questions you have with your health care provider. Document Released: 10/20/2008 Document Revised: 04/14/2018 Document Reviewed: 01/03/2016 Elsevier Patient Education  2020 Elsevier Inc. Ethinyl Estradiol; Norethindrone Acetate; Ferrous fumarate tablets or capsules What is  this medicine? ETHINYL ESTRADIOL; NORETHINDRONE ACETATE; FERROUS FUMARATE (ETH in il es tra DYE ole; nor eth IN drone AS e tate; FER us FUE ma rate) is an oral contraceptive. The products combine two types of female hormones, an estrogen and a progestin. They are used to prevent ovulation and pregnancy. Some products are also used to treat acne in females. This medicine may be used for other purposes; ask your health care provider or pharmacist if you have questions. COMMON BRAND NAME(S): Aurovela [address]24 Fe 1/20, Aurovela Fe, Blisovi [address]24 Fe, [address], Estrostep Fe, Gildess [address]24 Fe, [address] 1.5/30, Gildess Fe 1/20, Hailey 24 Fe, Junel Fe 1.5/30, Junel Fe 1/20, Junel Fe 24, Larin Fe, Lo Loestrin Fe, Loestrin 24 Fe, Loestrin FE 1.5/30, Loestrin FE 1/20, Lomedia 24 Fe, Microgestin 24 Fe, Microgestin Fe 1.5/30, Microgestin Fe 1/20, Tarina 24 Fe, Tarina Fe 1/20, Taytulla, Tilia Fe, Tri-Legest Fe What should I tell my health care provider before I take this medicine? They need to know if you have any of these conditions:  abnormal vaginal bleeding  blood vessel disease  breast, cervical, endometrial, ovarian, liver, or uterine cancer  diabetes  gallbladder disease  heart disease or recent heart attack  high blood pressure  high cholesterol  history of blood clots  kidney disease  liver disease  migraine headaches  smoke tobacco  stroke  systemic lupus erythematosus (SLE)  an unusual or allergic reaction to estrogens, progestins, other medicines, foods, dyes, or preservatives  pregnant or trying to get pregnant  breast-feeding How should I use this medicine? Take this medicine by mouth. To reduce nausea, this medicine may be taken with food. Follow the directions on the prescription label. Take this medicine at the same time each day and in the order directed on the package. Do not take your medicine more often than directed. A patient package insert for the product will be given  with each prescription and refill. Read this sheet carefully each time. The sheet may change frequently. Contact your pediatrician regarding the use of this medicine in children. Special care may be needed. This medicine has been used in female children who have started having menstrual periods. Overdosage: If you think you have taken too much of this medicine contact a poison control center or emergency room at once. NOTE: This medicine is only for you. Do not share this medicine with others. What if I miss a dose? If you miss a dose, refer to the patient information sheet you received with your medicine for direction. If you miss more than one pill, this medicine may not be as effective and you may need to use another form of birth control. What may interact with this medicine? Do not take this medicine with the following medication:  dasabuvir; ombitasvir; paritaprevir; ritonavir  ombitasvir; paritaprevir; ritonavir This medicine may also interact with the following medications:  acetaminophen  antibiotics or  medicines for infections, especially rifampin, rifabutin, rifapentine, and griseofulvin, and possibly penicillins or tetracyclines  aprepitant  ascorbic acid (vitamin C)  atorvastatin  barbiturate medicines, such as phenobarbital  bosentan  carbamazepine  caffeine  clofibrate  cyclosporine  dantrolene  doxercalciferol  felbamate  grapefruit juice  hydrocortisone  medicines for anxiety or sleeping problems, such as diazepam or temazepam  medicines for diabetes, including pioglitazone  mineral oil  modafinil  mycophenolate  nefazodone  oxcarbazepine  phenytoin  prednisolone  ritonavir or other medicines for HIV infection or AIDS  rosuvastatin  selegiline  soy isoflavones supplements  St. John's wort  tamoxifen or raloxifene  theophylline  thyroid hormones  topiramate  warfarin This list may not describe all possible  interactions. Give your health care provider a list of all the medicines, herbs, non-prescription drugs, or dietary supplements you use. Also tell them if you smoke, drink alcohol, or use illegal drugs. Some items may interact with your medicine. What should I watch for while using this medicine? Visit your doctor or health care professional for regular checks on your progress. You will need a regular breast and pelvic exam and Pap smear while on this medicine. Use an additional method of contraception during the first cycle that you take these tablets. If you have any reason to think you are pregnant, stop taking this medicine right away and contact your doctor or health care professional. If you are taking this medicine for hormone related problems, it may take several cycles of use to see improvement in your condition. Smoking increases the risk of getting a blood clot or having a stroke while you are taking birth control pills, especially if you are more than 21 years old. You are strongly advised not to smoke. This medicine can make your body retain fluid, making your fingers, hands, or ankles swell. Your blood pressure can go up. Contact your doctor or health care professional if you feel you are retaining fluid. This medicine can make you more sensitive to the sun. Keep out of the sun. If you cannot avoid being in the sun, wear protective clothing and use sunscreen. Do not use sun lamps or tanning beds/booths. If you wear contact lenses and notice visual changes, or if the lenses begin to feel uncomfortable, consult your eye care specialist. In some women, tenderness, swelling, or minor bleeding of the gums may occur. Notify your dentist if this happens. Brushing and flossing your teeth regularly may help limit this. See your dentist regularly and inform your dentist of the medicines you are taking. If you are going to have elective surgery, you may need to stop taking this medicine before the  surgery. Consult your health care professional for advice. This medicine does not protect you against HIV infection (AIDS) or any other sexually transmitted diseases. What side effects may I notice from receiving this medicine? Side effects that you should report to your doctor or health care professional as soon as possible:  allergic reactions like skin rash, itching or hives, swelling of the face, lips, or tongue  breast tissue changes or discharge  changes in vaginal bleeding during your period or between your periods  changes in vision  chest pain  confusion  coughing up blood  dizziness  feeling faint or lightheaded  headaches or migraines  leg, arm or groin pain  loss of balance or coordination  severe or sudden headaches  stomach pain (severe)  sudden shortness of breath  sudden numbness or weakness of the face,  arm or leg  symptoms of vaginal infection like itching, irritation or unusual discharge  tenderness in the upper abdomen  trouble speaking or understanding  vomiting  yellowing of the eyes or skin Side effects that usually do not require medical attention (report to your doctor or health care professional if they continue or are bothersome):  breakthrough bleeding and spotting that continues beyond the 3 initial cycles of pills  breast tenderness  mood changes, anxiety, depression, frustration, anger, or emotional outbursts  increased sensitivity to sun or ultraviolet light  nausea  skin rash, acne, or brown spots on the skin  weight gain (slight) This list may not describe all possible side effects. Call your doctor for medical advice about side effects. You may report side effects to FDA at 1-800-FDA-1088. Where should I keep my medicine? Keep out of the reach of children. Store at room temperature between 15 and 30 degrees C (59 and 86 degrees F). Throw away any unused medicine after the expiration date. NOTE: This sheet is a  summary. It may not cover all possible information. If you have questions about this medicine, talk to your doctor, pharmacist, or health care provider.  2020 Elsevier/Gold Standard (2015-09-03 08:04:41)

## 2018-09-01 NOTE — Progress Notes (Signed)
[facility]   Annual Physical  Subjective:  Patient ID: Samantha Foster, female    DOB: [birth year]  Age: 23 y.o. MRN: [id number]  CC:  Chief Complaint  Patient presents with   discuss birth control    wants to discuss starting birth control     HPI Samantha Foster is a 23 year female who presents for Annual Physical today.   No past medical history on file.   Current Status: Since her last office visit, she is doing well with no complaints. She previously has GI consult in [place], [place]. She denies fevers, chills, fatigue, recent infections, weight loss, and night sweats. She has not had any headaches, visual changes, dizziness, and falls. No chest pain, heart palpitations, cough and shortness of breath reported. No reports of GI problems such as nausea, vomiting, diarrhea, and constipation. She has no reports of blood in stools, dysuria and hematuria. No depression or anxiety reported today. She denies pain today. She is requesting information on contraceptive choices.   No past surgical history on file.  Family History  Problem Relation Age of Onset   Other Mother        Healthy   Other Father        Healthy     Social History   Socioeconomic History   Marital status: Significant Other    Spouse name: Not on file   Number of children: Not on file   Years of education: Not on file   Highest education level: Not on file  Occupational History   Not on file  Social Needs   Financial resource strain: Not on file   Food insecurity    Worry: Not on file    Inability: Not on file   Transportation needs    Medical: Not on file    Non-medical: Not on file  Tobacco Use   Smoking status: Never Smoker   Smokeless tobacco: Never Used  Substance and Sexual Activity   Alcohol use: Yes   Drug use: No   Sexual activity: Never  Lifestyle   Physical activity    Days per week: Not on file    Minutes per  session: Not on file   Stress: Not on file  Relationships   Social connections    Talks on phone: Not on file    Gets together: Not on file    Attends religious service: Not on file    Active member of club or organization: Not on file    Attends meetings of clubs or organizations: Not on file    Relationship status: Not on file   Intimate partner violence    Fear of current or ex partner: Not on file    Emotionally abused: Not on file    Physically abused: Not on file    Forced sexual activity: Not on file  Other Topics Concern   Not on file  Social History Narrative   Not on file    Outpatient Medications Prior to Visit  Medication Sig Dispense Refill   docusate sodium (COLACE) 100 MG capsule Take 1 capsule (100 mg total) by mouth 2 (two) times daily. (Patient not taking: Reported on 09/01/2018) 10 capsule 0   ipratropium (ATROVENT) 0.06 % nasal spray Place 2 sprays into both nostrils 4 (four) times daily. (Patient not taking: Reported on 03/23/2018) 15 mL 0   ondansetron (ZOFRAN) 4 MG tablet Take 1 tablet (4 mg total) by mouth every 6 (six)  hours. (Patient not taking: Reported on 09/01/2018) 20 tablet 1   polyethylene glycol powder (GLYCOLAX/MIRALAX) powder Take 17 g by mouth daily. (Patient not taking: Reported on 09/01/2018) 3350 g 1   No facility-administered medications prior to visit.     No Known Allergies  ROS Review of Systems  Constitutional: Negative.   HENT: Negative.   Eyes: Negative.   Respiratory: Negative.   Cardiovascular: Negative.   Gastrointestinal: Positive for constipation (occasional ).  Endocrine: Negative.   Genitourinary: Negative.   Musculoskeletal: Negative.   Skin: Negative.   Allergic/Immunologic: Negative.   Neurological: Negative.   Hematological: Negative.   Psychiatric/Behavioral: Negative.    Objective:    Physical Exam  Constitutional: She is oriented to person, place, and time. She appears well-developed and  well-nourished.  HENT:  Head: Normocephalic and atraumatic.  Eyes: Conjunctivae are normal.  Neck: Normal range of motion. Neck supple.  Cardiovascular: Normal rate, regular rhythm, normal heart sounds and intact distal pulses.  Pulmonary/Chest: Effort normal and breath sounds normal.  Abdominal: Soft. Bowel sounds are normal.  Musculoskeletal: Normal range of motion.  Neurological: She is alert and oriented to person, place, and time. She has normal reflexes.  Skin: Skin is warm and dry.  Psychiatric: She has a normal mood and affect. Her behavior is normal. Judgment and thought content normal.    BP 102/68 (BP Location: Left Arm, Patient Position: Sitting, Cuff Size: Normal)    Pulse 76    Temp 98.6 F (37 C) (Oral)    Ht 5\' 1"  (1.549 m)    Wt 131 lb 12.8 oz (59.8 kg)    LMP 08/13/2018    BMI 24.90 kg/m  Wt Readings from Last 3 Encounters:  09/01/18 131 lb 12.8 oz (59.8 kg)  03/23/18 136 lb 3.2 oz (61.8 kg)    Health Maintenance Due  Topic Date Due   HIV Screening  12/03/2011   PAP-Cervical Cytology Screening  12/02/2017   PAP SMEAR-Modifier  12/02/2017   INFLUENZA VACCINE  08/07/2018    There are no preventive care reminders to display for this patient.  No results found for: TSH Lab Results  Component Value Date   WBC 6.8 03/23/2018   HGB 12.3 03/23/2018   HCT 35.5 03/23/2018   MCV 85 03/23/2018   PLT 289 03/23/2018   Lab Results  Component Value Date   NA 140 03/23/2018   K 4.1 03/23/2018   CO2 23 03/23/2018   GLUCOSE 86 03/23/2018   BUN 5 (L) 03/23/2018   CREATININE 0.80 03/23/2018   BILITOT 0.4 03/23/2018   ALKPHOS 69 03/23/2018   AST 18 03/23/2018   ALT 8 03/23/2018   PROT 7.0 03/23/2018   ALBUMIN 4.5 03/23/2018   CALCIUM 9.4 03/23/2018   No results found for: CHOL No results found for: HDL No results found for: LDLCALC No results found for: TRIG No results found for: [facility] Lab Results  Component Value Date   HGBA1C 5.0 03/23/2018        Assessment & Plan:   1. Annual physical exam Exam is negative for abnormalities.  - POCT Urinalysis Dipstick  2. Nausea Pregnancy test is negative today.  - POCT urine pregnancy  3. Constipation, unspecified constipation type Stable.   4. Uses oral contraceptives as primary birth control method We will initiate oral birth control pills today.  - Norethindrone-Ethinyl Estradiol-Fe Biphas (LO LOESTRIN FE) 1 MG-10 MCG / 10 MCG tablet; Take 1 tablet by mouth daily.  Dispense: 1 Package; Refill:  11  5. Follow up She will follow up in 6 months.   Meds ordered this encounter  Medications   Norethindrone-Ethinyl Estradiol-Fe Biphas (LO LOESTRIN FE) 1 MG-10 MCG / 10 MCG tablet    Sig: Take 1 tablet by mouth daily.    Dispense:  1 Package    Refill:  11    Orders Placed This Encounter  Procedures   POCT urine pregnancy   POCT Urinalysis Dipstick    Referral Orders  No referral(s) requested today    Raliegh Ip,  MSN, FNP-BC [facility] [address] [place], [place] [address] [phone]- fax    Problem List Items Addressed This Visit      Other   Constipation   Relevant Orders   POCT urine pregnancy (Completed)   Nausea    Other Visit Diagnoses    Annual physical exam    -  Primary   Relevant Orders   POCT Urinalysis Dipstick (Completed)   Follow up       Uses oral contraceptives as primary birth control method       Relevant Medications   Norethindrone-Ethinyl Estradiol-Fe Biphas (LO LOESTRIN FE) 1 MG-10 MCG / 10 MCG tablet      Meds ordered this encounter  Medications   Norethindrone-Ethinyl Estradiol-Fe Biphas (LO LOESTRIN FE) 1 MG-10 MCG / 10 MCG tablet    Sig: Take 1 tablet by mouth daily.    Dispense:  1 Package    Refill:  11    Follow-up: Return in about 6 months (around 03/04/2019).    Kallie Locks, FNP

## 2018-09-20 ENCOUNTER — Other Ambulatory Visit: Payer: Self-pay

## 2018-09-20 ENCOUNTER — Ambulatory Visit (INDEPENDENT_AMBULATORY_CARE_PROVIDER_SITE_OTHER): Payer: Federal, State, Local not specified - PPO | Admitting: Family Medicine

## 2018-09-20 ENCOUNTER — Encounter: Payer: Self-pay | Admitting: Family Medicine

## 2018-09-20 VITALS — BP 112/75 | HR 75 | Temp 98.8°F | Ht 61.0 in | Wt 133.4 lb

## 2018-09-20 DIAGNOSIS — Z789 Other specified health status: Secondary | ICD-10-CM

## 2018-09-20 DIAGNOSIS — Z3200 Encounter for pregnancy test, result unknown: Secondary | ICD-10-CM

## 2018-09-20 DIAGNOSIS — Z3202 Encounter for pregnancy test, result negative: Secondary | ICD-10-CM | POA: Diagnosis not present

## 2018-09-20 DIAGNOSIS — N898 Other specified noninflammatory disorders of vagina: Secondary | ICD-10-CM

## 2018-09-20 DIAGNOSIS — R110 Nausea: Secondary | ICD-10-CM | POA: Diagnosis not present

## 2018-09-20 DIAGNOSIS — N920 Excessive and frequent menstruation with regular cycle: Secondary | ICD-10-CM

## 2018-09-20 DIAGNOSIS — Z09 Encounter for follow-up examination after completed treatment for conditions other than malignant neoplasm: Secondary | ICD-10-CM

## 2018-09-20 DIAGNOSIS — N390 Urinary tract infection, site not specified: Secondary | ICD-10-CM

## 2018-09-20 LAB — POCT URINALYSIS DIPSTICK
Bilirubin, UA: NEGATIVE
Glucose, UA: NEGATIVE
Ketones, UA: NEGATIVE
Nitrite, UA: NEGATIVE
Protein, UA: NEGATIVE
Spec Grav, UA: >=1.03 — AB (ref 1.010–1.025)
Urobilinogen, UA: 0.2 E.U./dL
pH, UA: 6.5 (ref 5.0–8.0)

## 2018-09-20 LAB — POCT URINE PREGNANCY: Preg Test, Ur: NEGATIVE

## 2018-09-20 MED ORDER — SULFAMETHOXAZOLE-TRIMETHOPRIM 800-160 MG PO TABS: 1.0000 | ORAL_TABLET | Freq: Two times a day (BID) | ORAL | 0 refills | Status: DC

## 2018-09-20 NOTE — Progress Notes (Signed)
Patient [facility]  Sick Visit  Subjective:  Patient ID: Samantha Foster, female    DOB: [birth year]  Age: 23 y.o. MRN: [id number]  CC:  Chief Complaint  Patient presents with  . having side efect from birth control    having  alot of crampng,and spotting off and on , icthy     HPI Drusilla Wampole is a 23 year old who presents for a Sick Visit today.   No past medical history on file.  Current Status: Since her last office visit, she recently took Plan B tablet, and immediately began oral birth control afterwards. She then began to develop increasing cramps. She is not sure if she wants to continue birth control pills with these side effects. She has c/o of vaginal irritation, discharge, and painful sex. She denies urinary frequency, discharge, dysuria, urinary itching, burning, odor, hematuria, and suprapubic pain/discomfort.   She denies fevers, chills, fatigue, recent infections, weight loss, and night sweats. She has not had any headaches, visual changes, dizziness, and falls. No chest pain, heart palpitations, cough and shortness of breath reported. No reports of GI problems such as nausea, vomiting, diarrhea, and constipation. She has no reports of blood in stools, dysuria and hematuria. No depression or anxiety, and denies suicidal ideations, homicidal ideations, or auditory hallucinations. She denies pain today.   No past surgical history on file.  Family History  Problem Relation Age of Onset  . Other Mother        Healthy  . Other Father        Healthy     Social History   Socioeconomic History  . Marital status: Significant Other    Spouse name: Not on file  . Number of children: Not on file  . Years of education: Not on file  . Highest education level: Not on file  Occupational History  . Not on file  Social Needs  . Financial resource strain: Not on file  . Food insecurity    Worry: Not on file    Inability: Not  on file  . Transportation needs    Medical: Not on file    Non-medical: Not on file  Tobacco Use  . Smoking status: Never Smoker  . Smokeless tobacco: Never Used  Substance and Sexual Activity  . Alcohol use: Yes  . Drug use: No  . Sexual activity: Never  Lifestyle  . Physical activity    Days per week: Not on file    Minutes per session: Not on file  . Stress: Not on file  Relationships  . Social Herbalist on phone: Not on file    Gets together: Not on file    Attends religious service: Not on file    Active member of club or organization: Not on file    Attends meetings of clubs or organizations: Not on file    Relationship status: Not on file  . Intimate partner violence    Fear of current or ex partner: Not on file    Emotionally abused: Not on file    Physically abused: Not on file    Forced sexual activity: Not on file  Other Topics Concern  . Not on file  Social History Narrative  . Not on file    Outpatient Medications Prior to Visit  Medication Sig Dispense Refill  . Norethindrone-Ethinyl Estradiol-Fe Biphas (LO LOESTRIN FE) 1 MG-10 MCG / 10 MCG tablet Take 1 tablet by  mouth daily. 1 Package 11  . ipratropium (ATROVENT) 0.06 % nasal spray Place 2 sprays into both nostrils 4 (four) times daily. (Patient not taking: Reported on 09/20/2018) 15 mL 0  . docusate sodium (COLACE) 100 MG capsule Take 1 capsule (100 mg total) by mouth 2 (two) times daily. (Patient not taking: Reported on 09/01/2018) 10 capsule 0  . ondansetron (ZOFRAN) 4 MG tablet Take 1 tablet (4 mg total) by mouth every 6 (six) hours. (Patient not taking: Reported on 09/01/2018) 20 tablet 1  . polyethylene glycol powder (GLYCOLAX/MIRALAX) powder Take 17 g by mouth daily. (Patient not taking: Reported on 09/01/2018) 3350 g 1   No facility-administered medications prior to visit.     No Known Allergies  ROS Review of Systems  Constitutional: Negative.   HENT: Negative.   Eyes: Negative.    Respiratory: Negative.   Cardiovascular: Negative.   Gastrointestinal: Negative.   Endocrine: Negative.   Genitourinary: Positive for menstrual problem (menorrhagia) and vaginal discharge.       Vaginal irritation.  Musculoskeletal: Negative.   Allergic/Immunologic: Negative.   Neurological: Negative.   Hematological: Negative.   Psychiatric/Behavioral: Negative.    Objective:    Physical Exam  Constitutional: She is oriented to person, place, and time. She appears well-developed and well-nourished.  HENT:  Head: Normocephalic and atraumatic.  Eyes: Conjunctivae are normal.  Neck: Normal range of motion. Neck supple.  Cardiovascular: Normal rate, regular rhythm, normal heart sounds and intact distal pulses.  Pulmonary/Chest: Effort normal and breath sounds normal.  Abdominal: Soft. Bowel sounds are normal.  Musculoskeletal: Normal range of motion.  Neurological: She is alert and oriented to person, place, and time. She has normal reflexes.  Skin: Skin is warm and dry.  Psychiatric: She has a normal mood and affect. Her behavior is normal. Judgment and thought content normal.  Nursing note and vitals reviewed.   BP 112/75 (BP Location: Left Arm, Patient Position: Sitting, Cuff Size: Normal)   Pulse 75   Temp 98.8 F (37.1 C) (Oral)   Ht 5\' 1"  (1.549 m)   Wt 133 lb 6.4 oz (60.5 kg)   SpO2 100%   BMI 25.21 kg/m  Wt Readings from Last 3 Encounters:  09/20/18 133 lb 6.4 oz (60.5 kg)  09/01/18 131 lb 12.8 oz (59.8 kg)  03/23/18 136 lb 3.2 oz (61.8 kg)     Health Maintenance Due  Topic Date Due  . HIV Screening  12/03/2011  . PAP-Cervical Cytology Screening  12/02/2017  . PAP SMEAR-Modifier  12/02/2017  . INFLUENZA VACCINE  08/07/2018    There are no preventive care reminders to display for this patient.  No results found for: TSH Lab Results  Component Value Date   WBC 6.8 03/23/2018   HGB 12.3 03/23/2018   HCT 35.5 03/23/2018   MCV 85 03/23/2018   PLT 289  03/23/2018   Lab Results  Component Value Date   NA 140 03/23/2018   K 4.1 03/23/2018   CO2 23 03/23/2018   GLUCOSE 86 03/23/2018   BUN 5 (L) 03/23/2018   CREATININE 0.80 03/23/2018   BILITOT 0.4 03/23/2018   ALKPHOS 69 03/23/2018   AST 18 03/23/2018   ALT 8 03/23/2018   PROT 7.0 03/23/2018   ALBUMIN 4.5 03/23/2018   CALCIUM 9.4 03/23/2018   No results found for: CHOL No results found for: HDL No results found for: LDLCALC No results found for: TRIG No results found for: [facility] Lab Results  Component Value Date  HGBA1C 5.0 03/23/2018   Assessment & Plan:   1. Menorrhagia with regular cycle  2. Vaginal irritation - POCT Urinalysis Dipstick - NuSwab Vaginitis Plus (VG+)  3. Vaginal discharge Results are pending. - NuSwab Vaginitis Plus (VG+)  4. Uses oral contraceptives as primary birth control method She will continue oral birth control pills at this time.   5. Nausea Stable.   6. Possible pregnancy Urine pregnancy test is negative today.  - POCT urine pregnancy  7. Urinary tract infection without hematuria, site unspecified We will initiate Bactrim today.  - sulfamethoxazole-trimethoprim (BACTRIM DS) 800-160 MG tablet; Take 1 tablet by mouth 2 (two) times daily for 7 days.  Dispense: 14 tablet; Refill: 0  8. Follow up She will follow up in 6 months.   Meds ordered this encounter  Medications  . sulfamethoxazole-trimethoprim (BACTRIM DS) 800-160 MG tablet    Sig: Take 1 tablet by mouth 2 (two) times daily for 7 days.    Dispense:  14 tablet    Refill:  0    Orders Placed This Encounter  Procedures  . NuSwab Vaginitis Plus (VG+)  . POCT Urinalysis Dipstick  . POCT urine pregnancy    Referral Orders  No referral(s) requested today    Raliegh Ip,  MSN, FNP-BC [facility] [address] [place], [place] [address] [phone] fax  Problem List Items  Addressed This Visit      Other   Nausea   Uses oral contraceptives as primary birth control method    Other Visit Diagnoses    Menorrhagia with regular cycle    -  Primary   Vaginal irritation       Relevant Orders   POCT Urinalysis Dipstick (Completed)   NuSwab Vaginitis Plus (VG+)   Vaginal discharge       Relevant Orders   NuSwab Vaginitis Plus (VG+)   Possible pregnancy       Relevant Orders   POCT urine pregnancy (Completed)   Urinary tract infection without hematuria, site unspecified       Relevant Medications   sulfamethoxazole-trimethoprim (BACTRIM DS) 800-160 MG tablet   Follow up          Meds ordered this encounter  Medications  . sulfamethoxazole-trimethoprim (BACTRIM DS) 800-160 MG tablet    Sig: Take 1 tablet by mouth 2 (two) times daily for 7 days.    Dispense:  14 tablet    Refill:  0    Follow-up: Return in about 6 months (around 03/20/2019).    Kallie Locks, FNP

## 2018-09-22 ENCOUNTER — Other Ambulatory Visit: Payer: Self-pay

## 2018-09-22 ENCOUNTER — Other Ambulatory Visit (HOSPITAL_COMMUNITY)
Admission: RE | Admit: 2018-09-22 | Discharge: 2018-09-22 | Disposition: A | Discharge status: Home / Self Care | Payer: Federal, State, Local not specified - PPO | Source: Ambulatory Visit | Attending: Obstetrics and Gynecology | Admitting: Obstetrics and Gynecology

## 2018-09-22 ENCOUNTER — Ambulatory Visit (INDEPENDENT_AMBULATORY_CARE_PROVIDER_SITE_OTHER): Payer: Federal, State, Local not specified - PPO | Admitting: Obstetrics and Gynecology

## 2018-09-22 ENCOUNTER — Encounter: Payer: Self-pay | Admitting: Obstetrics and Gynecology

## 2018-09-22 VITALS — BP 106/69 | HR 78 | Resp 16 | Ht 60.0 in | Wt 133.0 lb

## 2018-09-22 DIAGNOSIS — Z01419 Encounter for gynecological examination (general) (routine) without abnormal findings: Secondary | ICD-10-CM

## 2018-09-22 DIAGNOSIS — N898 Other specified noninflammatory disorders of vagina: Secondary | ICD-10-CM

## 2018-09-22 DIAGNOSIS — Z789 Other specified health status: Secondary | ICD-10-CM

## 2018-09-22 MED ORDER — FLUCONAZOLE 150 MG PO TABS: 150.0000 mg | ORAL_TABLET | Freq: Once | ORAL | 0 refills | Status: AC

## 2018-09-22 MED ORDER — LO LOESTRIN FE 1 MG-10 MCG / 10 MCG PO TABS: 1.0000 | ORAL_TABLET | Freq: Every day | ORAL | 11 refills | Status: DC

## 2018-09-22 NOTE — Patient Instructions (Signed)

## 2018-09-22 NOTE — Progress Notes (Deleted)
GYNECOLOGY ANNUAL PREVENTATIVE CARE ENCOUNTER NOTE  History:     Samantha Foster is a 23 y.o. G0P0 female here for a routine annual gynecologic exam.  Current complaints: ***.   Denies abnormal vaginal bleeding, discharge, pelvic pain, problems with intercourse or other gynecologic concerns.    Gynecologic History Patient's last menstrual period was 09/22/2018. Contraception: {method:5051} Last Pap: ***. Results were: {norm/abn:16337} with negative HPV Last mammogram: ***. Results were: {norm/abn:16337}  Obstetric History OB History  Gravida Para Term Preterm AB Living  0            SAB TAB Ectopic Multiple Live Births               No past medical history on file.  Past Surgical History:  Procedure Laterality Date  . WISDOM TOOTH EXTRACTION      Current Outpatient Medications on File Prior to Visit  Medication Sig Dispense Refill  . Norethindrone-Ethinyl Estradiol-Fe Biphas (LO LOESTRIN FE) 1 MG-10 MCG / 10 MCG tablet Take 1 tablet by mouth daily. 1 Package 11   No current facility-administered medications on file prior to visit.     No Known Allergies  Social History:  reports that she has never smoked. She has never used smokeless tobacco. She reports current alcohol use. She reports that she does not use drugs.  Family History  Problem Relation Age of Onset  . Other Mother        Healthy  . Other Father        Healthy     The following portions of the patient's history were reviewed and updated as appropriate: allergies, current medications, past family history, past medical history, past social history, past surgical history and problem list.  Review of Systems Pertinent items noted in HPI and remainder of comprehensive ROS otherwise negative.  Physical Exam:  BP 106/69   Pulse 78   Resp 16   Ht 5' (1.524 m)   Wt 133 lb (60.3 kg)   LMP 09/22/2018   BMI 25.97 kg/m  CONSTITUTIONAL: Well-developed, well-nourished female in no acute distress.  HENT:   Normocephalic, atraumatic, External right and left ear normal. Oropharynx is clear and moist EYES: Conjunctivae and EOM are normal. Pupils are equal, round, and reactive to light. No scleral icterus.  NECK: Normal range of motion, supple, no masses.  Normal thyroid.  SKIN: Skin is warm and dry. No rash noted. Not diaphoretic. No erythema. No pallor. MUSCULOSKELETAL: Normal range of motion. No tenderness.  No cyanosis, clubbing, or edema.  2+ distal pulses. NEUROLOGIC: Alert and oriented to person, place, and time. Normal reflexes, muscle tone coordination. No cranial nerve deficit noted. PSYCHIATRIC: Normal mood and affect. Normal behavior. Normal judgment and thought content. CARDIOVASCULAR: Normal heart rate noted, regular rhythm RESPIRATORY: Clear to auscultation bilaterally. Effort and breath sounds normal, no problems with respiration noted. BREASTS: Symmetric in size. No masses, skin changes, nipple drainage, or lymphadenopathy. ABDOMEN: Soft, normal bowel sounds, no distention noted.  No tenderness, rebound or guarding.  PELVIC: Normal appearing external genitalia; normal appearing vaginal mucosa and cervix.  No abnormal discharge noted.  Pap smear obtained.  Normal uterine size, no other palpable masses, no uterine or adnexal tenderness.   Assessment and Plan:    There are no diagnoses linked to this encounter. Will follow up results of pap smear and manage accordingly. Mammogram scheduled Routine preventative health maintenance measures emphasized. Please refer to After Visit Summary for other counseling recommendations.  Jaynie Collins, MD, FACOG Obstetrician & Gynecologist, [facility] for [employer], [facility]

## 2018-09-22 NOTE — Progress Notes (Signed)
GYNECOLOGY ANNUAL PREVENTATIVE CARE ENCOUNTER NOTE  History:     Samantha Foster is a 23 y.o. G0P0 female here for a routine annual gynecologic exam.  Current complaints: Irritation after intercourse. Seems to be worse since started [place] pills. Says it is mainly painful following intercourse; "feels irritated."     Denies abnormal vaginal bleeding, discharge  Gynecologic History Patient's last menstrual period was 09/22/2018. Contraception: OCP (estrogen/progesterone) Last Pap: Never d/t age.    Obstetric History OB History  Gravida Para Term Preterm AB Living  0            SAB TAB Ectopic Multiple Live Births               No past medical history on file.  Past Surgical History:  Procedure Laterality Date  . WISDOM TOOTH EXTRACTION      Current Outpatient Medications on File Prior to Visit  Medication Sig Dispense Refill  . Norethindrone-Ethinyl Estradiol-Fe Biphas (LO LOESTRIN FE) 1 MG-10 MCG / 10 MCG tablet Take 1 tablet by mouth daily. 1 Package 11   No current facility-administered medications on file prior to visit.     No Known Allergies  Social History:  reports that she has never smoked. She has never used smokeless tobacco. She reports current alcohol use. She reports that she does not use drugs.  Family History  Problem Relation Age of Onset  . Other Mother        Healthy  . Other Father        Healthy     The following portions of the patient's history were reviewed and updated as appropriate: allergies, current medications, past family history, past medical history, past social history, past surgical history and problem list.  Review of Systems Pertinent items noted in HPI and remainder of comprehensive ROS otherwise negative.  Physical Exam:  BP 106/69   Pulse 78   Resp 16   Ht 5' (1.524 m)   Wt 133 lb (60.3 kg)   LMP 09/22/2018   BMI 25.97 kg/m  CONSTITUTIONAL: Well-developed, well-nourished female in no acute distress.  HENT:   Normocephalic, atraumatic, External right and left ear normal. Oropharynx is clear and moist EYES: Conjunctivae and EOM are normal. Pupils are equal, round, and reactive to light. No scleral icterus.  NECK: Normal range of motion, supple, no masses.  Normal thyroid.  SKIN: Skin is warm and dry. No rash noted. Not diaphoretic. No erythema. No pallor. MUSCULOSKELETAL: Normal range of motion. No tenderness.  No cyanosis, clubbing, or edema.  2+ distal pulses. NEUROLOGIC: Alert and oriented to person, place, and time. Normal reflexes, muscle tone coordination. No cranial nerve deficit noted. PSYCHIATRIC: Normal mood and affect. Normal behavior. Normal judgment and thought content. CARDIOVASCULAR: Normal heart rate noted, regular rhythm RESPIRATORY: Clear to auscultation bilaterally. Effort and breath sounds normal, no problems with respiration noted. BREASTS: Symmetric in size. No masses, skin changes, nipple drainage, or lymphadenopathy. ABDOMEN: Soft, normal bowel sounds, no distention noted.  No tenderness, rebound or guarding.  PELVIC: Normal appearing external genitalia; thick white patches of white discharge in posterior vault.  Pap smear obtained.  Normal uterine size, no other palpable masses, no uterine or adnexal tenderness.   Assessment and Plan:    1. Well woman exam with routine gynecological exam  - HIV antibody (with reflex) - Cytology - PAP( [place]) - Rx: Diflucan - Encouraged lubrication during intercourse. Samples given today -GC collected today     Rudie Sermons, Anderson [place]  I, NP Will follow up results of pap smear and manage accordingly. Routine preventative health maintenance measures emphasized. Please refer to After Visit Summary for other counseling recommendations.       Biochemist, clinicalaculty Practice Center for [facility], [facility]

## 2018-09-23 LAB — HIV ANTIBODY (ROUTINE TESTING W REFLEX): HIV 1&2 Ab, 4th Generation: NONREACTIVE

## 2018-09-24 ENCOUNTER — Telehealth: Payer: Self-pay

## 2018-09-24 LAB — NUSWAB VAGINITIS PLUS (VG+)
Candida albicans, NAA: NEGATIVE
Candida glabrata, NAA: NEGATIVE
Chlamydia trachomatis, NAA: NEGATIVE
Neisseria gonorrhoeae, NAA: NEGATIVE
Trich vag by NAA: NEGATIVE

## 2018-09-24 NOTE — Telephone Encounter (Signed)
Called and spoke with patient, advised that swab is normal. Advised if she is still having symptoms to make an appointment to come back in. Thanks !

## 2018-09-26 LAB — CYTOLOGY - PAP
Adequacy: ABSENT
Chlamydia: NEGATIVE
Diagnosis: NEGATIVE
Molecular Disclaimer: NEGATIVE
Neisseria Gonorrhea: NEGATIVE

## 2018-11-10 ENCOUNTER — Ambulatory Visit (INDEPENDENT_AMBULATORY_CARE_PROVIDER_SITE_OTHER): Payer: Federal, State, Local not specified - PPO | Admitting: Obstetrics & Gynecology

## 2018-11-10 ENCOUNTER — Other Ambulatory Visit (HOSPITAL_COMMUNITY)
Admission: RE | Admit: 2018-11-10 | Discharge: 2018-11-10 | Disposition: A | Discharge status: Home / Self Care | Payer: Federal, State, Local not specified - PPO | Source: Ambulatory Visit | Attending: Obstetrics & Gynecology | Admitting: Obstetrics & Gynecology

## 2018-11-10 ENCOUNTER — Other Ambulatory Visit: Payer: Self-pay

## 2018-11-10 ENCOUNTER — Encounter: Payer: Self-pay | Admitting: Obstetrics & Gynecology

## 2018-11-10 VITALS — BP 101/71 | HR 88 | Resp 16 | Ht 60.0 in | Wt 132.0 lb

## 2018-11-10 DIAGNOSIS — B373 Candidiasis of vulva and vagina: Secondary | ICD-10-CM | POA: Diagnosis not present

## 2018-11-10 DIAGNOSIS — Z23 Encounter for immunization: Secondary | ICD-10-CM | POA: Diagnosis not present

## 2018-11-10 DIAGNOSIS — N898 Other specified noninflammatory disorders of vagina: Secondary | ICD-10-CM

## 2018-11-10 DIAGNOSIS — Z0000 Encounter for general adult medical examination without abnormal findings: Secondary | ICD-10-CM | POA: Insufficient documentation

## 2018-11-10 MED ORDER — FLUCONAZOLE 150 MG PO TABS: 150.0000 mg | ORAL_TABLET | Freq: Once | ORAL | 3 refills | Status: AC

## 2018-11-10 NOTE — Progress Notes (Signed)
   Subjective:    Patient ID: Samantha Foster, female    DOB: [birth year], 23 y.o.   MRN: [id number]  HPI 22 yo single G0 here with a recurrence of her vulvovaginal irritation. She had this in the past and diflucan helped. She stopped her OCPs about 2 weeks ago due to her insurance not covering it. She does like OCPs but couldn't afford them. She had unprotected sex and used Plan B.    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

## 2018-11-11 ENCOUNTER — Telehealth: Payer: Self-pay

## 2018-11-11 ENCOUNTER — Other Ambulatory Visit: Payer: Self-pay | Admitting: Obstetrics & Gynecology

## 2018-11-11 ENCOUNTER — Telehealth: Payer: Self-pay | Admitting: Obstetrics & Gynecology

## 2018-11-11 LAB — CERVICOVAGINAL ANCILLARY ONLY
Bacterial Vaginitis (gardnerella): NEGATIVE
Candida Glabrata: NEGATIVE
Candida Vaginitis: POSITIVE — AB
Chlamydia: NEGATIVE
Comment: NEGATIVE
Comment: NEGATIVE
Comment: NEGATIVE
Comment: NEGATIVE
Comment: NEGATIVE
Comment: NORMAL
Neisseria Gonorrhea: NEGATIVE
Trichomonas: NEGATIVE

## 2018-11-11 LAB — HEPATITIS B SURFACE ANTIGEN: Hepatitis B Surface Ag: NONREACTIVE

## 2018-11-11 LAB — RPR: RPR Ser Ql: NONREACTIVE

## 2018-11-11 LAB — HIV ANTIBODY (ROUTINE TESTING W REFLEX): HIV 1&2 Ab, 4th Generation: NONREACTIVE

## 2018-11-11 LAB — HSV 2 ANTIBODY, IGG: HSV 2 Glycoprotein G Ab, IgG: 1.07 index — ABNORMAL HIGH

## 2018-11-11 LAB — HEPATITIS C ANTIBODY
Hepatitis C Ab: NONREACTIVE
SIGNAL TO CUT-OFF: 0.12 (ref <1.00)

## 2018-11-11 MED ORDER — VALACYCLOVIR HCL 1 G PO TABS: 1000.0000 mg | ORAL_TABLET | Freq: Every day | ORAL | 6 refills | Status: DC

## 2018-11-11 NOTE — Telephone Encounter (Signed)
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

## 2018-11-11 NOTE — Progress Notes (Signed)
Valtrex 1 gram daily x 5 day with 6 refills

## 2018-11-11 NOTE — Telephone Encounter (Addendum)
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

## 2018-11-22 ENCOUNTER — Telehealth (INDEPENDENT_AMBULATORY_CARE_PROVIDER_SITE_OTHER): Payer: Federal, State, Local not specified - PPO | Admitting: Obstetrics and Gynecology

## 2018-11-22 DIAGNOSIS — R109 Unspecified abdominal pain: Secondary | ICD-10-CM

## 2018-11-22 DIAGNOSIS — Z789 Other specified health status: Secondary | ICD-10-CM | POA: Diagnosis not present

## 2018-11-22 NOTE — Progress Notes (Signed)
TELEHEALTH VIRTUAL GYNECOLOGY VISIT ENCOUNTER NOTE  I connected with Samantha Foster on 11/22/18 at 11:00 AM EST by telephone at home and verified that I am speaking with the correct person using two identifiers.   I discussed the limitations, risks, security and privacy concerns of performing an evaluation and management service by telephone and the availability of in person appointments. I also discussed with the patient that there may be a patient responsible charge related to this service. The patient expressed understanding and agreed to proceed.   History:  Samantha Foster is a 23 y.o. G0P0 female being evaluated today for abdominal cramping following plan B.   She attests to unprotected sex 2 weeks ago; took plan B the next day. At that time she had stopped her BC pills. She restarted her BC pills on Nov. 7. She missed 2 days of the pills that week on Nov 9th and Nov 11. She had unprotected Sex on 11/15.   LMP was second week in Oct. She has been having really bad cramping since taking plan B. She has had breast pain and feels like her period is going to start. She is taking tylenol or ibuprofen for the pain which helps. Typically has irregular periods. Her last period lasted about 2 weeks.  No bleeding.     No past medical history on file. Past Surgical History:  Procedure Laterality Date  . WISDOM TOOTH EXTRACTION     The following portions of the patient's history were reviewed and updated as appropriate: allergies, current medications, past family history, past medical history, past social history, past surgical history and problem list.   Health Maintenance:  Normal pap and negative HRHPV on 2020.   Review of Systems:  Pertinent items noted in HPI and remainder of comprehensive ROS otherwise negative.  Physical Exam:   General:  Alert, oriented and cooperative.   Mental Status: Normal mood and affect perceived. Normal judgment and thought content.  Physical exam  deferred due to nature of the encounter  Labs and Imaging Results for orders placed or performed in visit on 11/10/18 (from the past 336 hour(s))  Cervicovaginal ancillary only( [place])   Collection Time: 11/10/18 10:16 AM  Result Value Ref Range   Bacterial Vaginitis (gardnerella) Negative    Candida Vaginitis Positive (A)    Candida Glabrata Negative    Trichomonas Negative    Chlamydia Negative    Neisseria Gonorrhea Negative    Comment      Normal Reference Range Bacterial Vaginosis - Negative   Comment Normal Reference Range Candida Species - Negative    Comment Normal Reference Range Candida Galbrata - Negative    Comment Normal Reference Range Trichomonas - Negative    Comment Normal Reference [place] Chlamydia - Negative    Comment      Normal Reference Range Neisseria Gonorrhea - Negative  HSV 2 antibody, IgG   Collection Time: 11/10/18 10:19 AM  Result Value Ref Range   HSV 2 Glycoprotein G Ab, IgG 1.07 (H) index  HIV antibody (with reflex)   Collection Time: 11/10/18 10:19 AM  Result Value Ref Range   HIV 1&2 Ab, 4th Generation NON-REACTIVE NON-REACTI  Hepatitis B surface antigen   Collection Time: 11/10/18 10:19 AM  Result Value Ref Range   Hepatitis B Surface Ag NON-REACTIVE NON-REACTI  Hepatitis C antibody   Collection Time: 11/10/18 10:19 AM  Result Value Ref Range   Hepatitis C Ab NON-REACTIVE NON-REACTI   SIGNAL TO CUT-OFF 0.12 <1.00  RPR   Collection Time: 11/10/18 10:19 AM  Result Value Ref Range   RPR Ser Ql NON-REACTIVE NON-REACTI   No results found.    Assessment and Plan:    1. Uses emergency contraception Take a pregnancy test today, if negative take Plan B Condoms/abstinense until birth control method decided.   2. Abdominal cramping  Pregnancy test today. Repeat pregnancy test in 5 days.  If negative resume BC pills. If positive call the office. Will need evaluation here or [facility]. Ok to use ibuprofen OTC as directed on the bottle.   It can be normal for Plan B to cause some abdominal cramping.     I discussed the assessment and treatment plan with the patient. The patient was provided an opportunity to ask questions and all were answered. The patient agreed with the plan and demonstrated an understanding of the instructions.   The patient was advised to call back or seek an in-person evaluation/go to the ED if the symptoms worsen or if the condition fails to improve as anticipated.  I provided 16 minutes of non-face-to-face time during this encounter.   Venia Carbon, NP Center for [employer], [facility]

## 2018-12-28 ENCOUNTER — Other Ambulatory Visit (HOSPITAL_COMMUNITY)
Admission: RE | Admit: 2018-12-28 | Discharge: 2018-12-28 | Disposition: A | Discharge status: Home / Self Care | Payer: Federal, State, Local not specified - PPO | Source: Ambulatory Visit | Attending: Certified Nurse Midwife | Admitting: Certified Nurse Midwife

## 2018-12-28 ENCOUNTER — Encounter: Payer: Self-pay | Admitting: Certified Nurse Midwife

## 2018-12-28 ENCOUNTER — Ambulatory Visit: Payer: Federal, State, Local not specified - PPO | Admitting: Certified Nurse Midwife

## 2018-12-28 ENCOUNTER — Other Ambulatory Visit: Payer: Self-pay

## 2018-12-28 VITALS — BP 92/61 | HR 71 | Temp 98.3°F | Resp 16 | Ht 60.0 in | Wt 130.0 lb

## 2018-12-28 DIAGNOSIS — Z113 Encounter for screening for infections with a predominantly sexual mode of transmission: Secondary | ICD-10-CM | POA: Insufficient documentation

## 2018-12-28 DIAGNOSIS — Z0000 Encounter for general adult medical examination without abnormal findings: Secondary | ICD-10-CM | POA: Diagnosis not present

## 2018-12-28 DIAGNOSIS — B373 Candidiasis of vulva and vagina: Secondary | ICD-10-CM | POA: Insufficient documentation

## 2018-12-28 DIAGNOSIS — N898 Other specified noninflammatory disorders of vagina: Secondary | ICD-10-CM

## 2018-12-28 MED ORDER — FLUCONAZOLE 150 MG PO TABS: 150.0000 mg | ORAL_TABLET | Freq: Every day | ORAL | 1 refills | Status: DC

## 2018-12-28 NOTE — Progress Notes (Signed)
History:  [name] is a 23 y.o. G0P0 who presents to clinic today for vaginal itching, odor, and discharge for 1 week.    The following portions of the patient's history were reviewed and updated as appropriate: allergies, current medications, family history, past medical history, social history, past surgical history and problem list.  Review of Systems:  Review of Systems  Constitutional: Negative.   HENT: Negative.   Eyes: Negative.   Respiratory: Negative.   Cardiovascular: Negative.   Gastrointestinal: Negative.   Genitourinary:       Vaginal itching, irritation, and discharge   Musculoskeletal: Negative.   Skin: Negative.   Neurological: Negative.   Endo/Heme/Allergies: Negative.   Psychiatric/Behavioral: Negative.   All other systems reviewed and are negative.     Objective:  Physical Exam BP 92/61   Pulse 71   Temp 98.3 F (36.8 C) (Temporal)   Resp 16   Ht 5' (1.524 m)   Wt 130 lb (59 kg)   LMP 11/22/2018   BMI 25.39 kg/m  Physical Exam Vitals and nursing note reviewed. Exam conducted with a chaperone present.  Constitutional:      Appearance: Normal appearance. She is normal weight.  HENT:     Head: Normocephalic and atraumatic.     Nose: Nose normal.     Mouth/Throat:     Mouth: Mucous membranes are moist.  Eyes:     Extraocular Movements: Extraocular movements intact.     Conjunctiva/sclera: Conjunctivae normal.     Pupils: Pupils are equal, round, and reactive to light.  Cardiovascular:     Rate and Rhythm: Normal rate.  Pulmonary:     Effort: Pulmonary effort is normal.  Abdominal:     General: Abdomen is flat.     Palpations: Abdomen is soft.  Genitourinary:    General: Normal vulva.     Vagina: Vaginal discharge present.     Comments: Labia minora red and appears irritated Musculoskeletal:        General: Normal range of motion.     Cervical back: Normal range of motion.  Skin:    General: Skin is warm and dry.   Neurological:     General: No focal deficit present.     Mental Status: She is alert. Mental status is at baseline. She is disoriented.  Psychiatric:        Mood and Affect: Mood normal.        Behavior: Behavior normal.        Thought Content: Thought content normal.        Judgment: Judgment normal.     Assessment & Plan:  1. Vaginal itching - Vaginal itching and irritation for 1 week - Swabs today. Patient does want STD screening - Pt requesting something to help relieve itching and irritation. Will send in Diflucan x1 dose  - Cervicovaginal ancillary only( [place])  2. Routine screening for STI (sexually transmitted infection) - Pt wanting STD screening  - RPR - HIV antibody (with reflex) - Hepatitis C Antibody - Hepatitis B Surface AntiGEN   Maryagnes Amos, SNM   I confirm that I have verified the information documented in the nurse midwife student's note and that I have also personally reperformed the history, physical exam and all medical decision making activities of this service and have verified that all service and findings are accurately documented in this student's note.   Will treat prophylactic with diflucan while results pending - will manage accordingly   Darrol Poke  C, CNM 12/28/2018 4:50 PM

## 2018-12-29 LAB — CERVICOVAGINAL ANCILLARY ONLY
Bacterial Vaginitis (gardnerella): NEGATIVE
Candida Glabrata: NEGATIVE
Candida Vaginitis: POSITIVE — AB
Chlamydia: NEGATIVE
Comment: NEGATIVE
Comment: NEGATIVE
Comment: NEGATIVE
Comment: NEGATIVE
Comment: NEGATIVE
Comment: NORMAL
Neisseria Gonorrhea: NEGATIVE
Trichomonas: NEGATIVE

## 2018-12-29 LAB — HIV ANTIBODY (ROUTINE TESTING W REFLEX): HIV 1&2 Ab, 4th Generation: NONREACTIVE

## 2018-12-29 LAB — HEPATITIS B SURFACE ANTIGEN: Hepatitis B Surface Ag: NONREACTIVE

## 2018-12-29 LAB — RPR: RPR Ser Ql: NONREACTIVE

## 2018-12-29 LAB — HEPATITIS C ANTIBODY
Hepatitis C Ab: NONREACTIVE
SIGNAL TO CUT-OFF: 0.1 (ref <1.00)

## 2019-01-27 ENCOUNTER — Other Ambulatory Visit: Payer: Self-pay | Admitting: *Deleted

## 2019-01-27 MED ORDER — VALACYCLOVIR HCL 1 G PO TABS: 1000.0000 mg | ORAL_TABLET | Freq: Every day | ORAL | 6 refills | Status: DC

## 2019-01-28 ENCOUNTER — Other Ambulatory Visit: Payer: Self-pay

## 2019-01-28 ENCOUNTER — Ambulatory Visit (INDEPENDENT_AMBULATORY_CARE_PROVIDER_SITE_OTHER): Payer: Federal, State, Local not specified - PPO | Admitting: Certified Nurse Midwife

## 2019-01-28 ENCOUNTER — Encounter: Payer: Self-pay | Admitting: Certified Nurse Midwife

## 2019-01-28 ENCOUNTER — Other Ambulatory Visit (HOSPITAL_COMMUNITY)
Admission: RE | Admit: 2019-01-28 | Discharge: 2019-01-28 | Disposition: A | Discharge status: Home / Self Care | Payer: Federal, State, Local not specified - PPO | Source: Ambulatory Visit | Attending: Certified Nurse Midwife | Admitting: Certified Nurse Midwife

## 2019-01-28 VITALS — BP 104/69 | HR 74 | Wt 131.0 lb

## 2019-01-28 DIAGNOSIS — Z3041 Encounter for surveillance of contraceptive pills: Secondary | ICD-10-CM | POA: Insufficient documentation

## 2019-01-28 DIAGNOSIS — N898 Other specified noninflammatory disorders of vagina: Secondary | ICD-10-CM | POA: Insufficient documentation

## 2019-01-28 DIAGNOSIS — B009 Herpesviral infection, unspecified: Secondary | ICD-10-CM

## 2019-01-28 MED ORDER — TERCONAZOLE 0.4 % VA CREA: 1.0000 | TOPICAL_CREAM | Freq: Every day | VAGINAL | 0 refills | Status: DC

## 2019-01-28 MED ORDER — NORETHIN ACE-ETH ESTRAD-FE 1.5-30 MG-MCG PO TABS: 1.0000 | ORAL_TABLET | Freq: Every day | ORAL | 4 refills | Status: DC

## 2019-01-28 NOTE — Progress Notes (Addendum)
History:  [name] is a 24 y.o. G0P0 who presents to clinic today for vaginal irritation, itching and "bumps" that started this past Wednesday.   She reports unprotected IC with 1 partner that has not changed recently. Denies changing in soaps or washes.    The following portions of the patient's history were reviewed and updated as appropriate: allergies, current medications, family history, past medical history, social history, past surgical history and problem list.  Review of Systems:  Review of Systems  Constitutional: Negative.   Respiratory: Negative.   Cardiovascular: Negative.   Gastrointestinal: Negative.   Genitourinary:       Vaginal irritation, itching and bumps   Musculoskeletal: Negative.   Neurological: Negative.      Objective:  Physical Exam BP 104/69   Pulse 74   Wt 131 lb (59.4 kg)   LMP  (LMP Unknown)   BMI 25.58 kg/m  Physical Exam Exam conducted with a chaperone present.  HENT:     Head: Normocephalic.  Cardiovascular:     Rate and Rhythm: Normal rate and regular rhythm.  Pulmonary:     Effort: Pulmonary effort is normal. No respiratory distress.     Breath sounds: Normal breath sounds. No wheezing.  Abdominal:     General: There is no distension.     Palpations: Abdomen is soft.     Tenderness: There is no abdominal tenderness.  Genitourinary:    Labia:        Right: Rash present. No lesion.      Vagina: Vaginal discharge present. No bleeding or lesions.     Cervix: No friability.     Uterus: Normal.      Adnexa: Right adnexa normal and left adnexa normal.       Comments: Pelvic exam: Cervix pink, visually closed, large amount of white curdy discharge without odor, Small patch of inflammation and crusted flat sores, non painful to touch, non blistering noted on right lower labia near introitus Bimanual exam: Cervix 0/long/high, firm, anterior, neg CMT, uterus nontender, nonenlarged, adnexa without tenderness, enlargement, or  mass Skin:    General: Skin is warm and dry.  Neurological:     Mental Status: She is alert and oriented to person, place, and time.  Psychiatric:        Mood and Affect: Mood normal.        Behavior: Behavior normal.        Thought Content: Thought content normal.    Assessment & Plan:  1. Vaginal irritation - Patient reports vaginal irritation and "bumps" that were itching, not painful started occurring on Wednesday  - flat inflammation of sores/rash most likely related to yeast infection, non painful, only itching  - She reports last unprotected IC this past Saturday  - She was recently tested for STDs and treated for yeast last month  - She believes that the recurrences is "something more than yeast", was tested for HSV in November and at that time test was equivocal. Discussed with patient that we can retest for true positive today  - Patient reports that if she is positive - would like to be on continuous suppression medication  - Cervicovaginal ancillary only( [place]) - HSV 2 antibody, IgG - terconazole (TERAZOL 7) 0.4 % vaginal cream; Place 1 applicator vaginally at bedtime. Use for seven days  Dispense: 45 g; Refill: 0  2. Vaginal discharge - Patient reports white clumpy discharge with irritation and itching  - Cervicovaginal ancillary only( [place])  3. Surveillance of contraceptive pill - received fax from [employer] employee program stating that Lo Loestrin Fe that patient is currently taking is not covered by insurance  - fax included list of alternatives oral contraceptives that are covered  - Reviewed list with patient - most similar medication is Loestrin Fe which is covered  - Discussed side effects with patient, patient reports on current Lo Loestrin she is having abnormal cycles, educated on change to Loestrin Fe with higher dose of progesterone with help to regulate cycles, patient verbalizes understanding and agrees to change in medication.  - Discussed  with patient that birth control prevents pregnancy and not STDs, encouraged use of condoms d/t patients recurrent concerns for STDs  - norethindrone-ethinyl estradiol-iron (LOESTRIN FE) 1.5-30 MG-MCG tablet; Take 1 tablet by mouth daily.  Dispense: 3 Package; Refill: 4   Follow up as scheduled for Gardasil injection  Will follow up with results and manage accordingly   Lajean Manes, CNM 01/28/2019 11:52 AM

## 2019-01-31 LAB — HSV 2 ANTIBODY, IGG: HSV 2 Glycoprotein G Ab, IgG: 1.91 index — ABNORMAL HIGH

## 2019-02-02 DIAGNOSIS — B009 Herpesviral infection, unspecified: Secondary | ICD-10-CM | POA: Insufficient documentation

## 2019-02-02 MED ORDER — VALACYCLOVIR HCL 500 MG PO TABS: 500.0000 mg | ORAL_TABLET | Freq: Every day | ORAL | 12 refills | Status: AC

## 2019-02-02 NOTE — Addendum Note (Signed)
Addended by: Sharyon Cable on: 02/02/2019 04:18 PM   Modules accepted: Orders

## 2019-02-03 LAB — CERVICOVAGINAL ANCILLARY ONLY
Bacterial Vaginitis (gardnerella): NEGATIVE
Candida Glabrata: NEGATIVE
Candida Vaginitis: POSITIVE — AB
Chlamydia: NEGATIVE
Comment: NEGATIVE
Comment: NEGATIVE
Comment: NEGATIVE
Comment: NEGATIVE
Comment: NEGATIVE
Comment: NORMAL
Neisseria Gonorrhea: NEGATIVE
Trichomonas: NEGATIVE

## 2019-02-04 ENCOUNTER — Encounter: Payer: Self-pay | Admitting: Certified Nurse Midwife

## 2019-03-03 ENCOUNTER — Telehealth: Payer: Self-pay | Admitting: Family Medicine

## 2019-03-03 NOTE — Telephone Encounter (Signed)
Pt was called and reminded of there appointment 

## 2019-03-04 ENCOUNTER — Ambulatory Visit (INDEPENDENT_AMBULATORY_CARE_PROVIDER_SITE_OTHER): Payer: Federal, State, Local not specified - PPO | Admitting: Family Medicine

## 2019-03-04 ENCOUNTER — Other Ambulatory Visit: Payer: Self-pay

## 2019-03-04 ENCOUNTER — Encounter: Payer: Self-pay | Admitting: Family Medicine

## 2019-03-04 VITALS — BP 117/70 | HR 77 | Temp 97.7°F | Ht 60.0 in | Wt 130.4 lb

## 2019-03-04 DIAGNOSIS — N898 Other specified noninflammatory disorders of vagina: Secondary | ICD-10-CM | POA: Diagnosis not present

## 2019-03-04 DIAGNOSIS — Z09 Encounter for follow-up examination after completed treatment for conditions other than malignant neoplasm: Secondary | ICD-10-CM | POA: Diagnosis not present

## 2019-03-04 DIAGNOSIS — N920 Excessive and frequent menstruation with regular cycle: Secondary | ICD-10-CM | POA: Diagnosis not present

## 2019-03-04 DIAGNOSIS — R110 Nausea: Secondary | ICD-10-CM

## 2019-03-04 NOTE — Progress Notes (Signed)
[facility]   Established Patient Office Visit  Subjective:  Patient ID: Samantha Foster, female    DOB: [birth year]  Age: 24 y.o. MRN: [id number]  CC:  Chief Complaint  Patient presents with  . Follow-up    HPI Samantha Foster is a 24 year old female who presents for Follow Up today.   History reviewed. No pertinent past medical history. Past Surgical History:  Procedure Laterality Date  . WISDOM TOOTH EXTRACTION     Current Status: Since her last office visit, she is doing well with no complaints. She has c/o of increasingly frequent yeast infections and BV. She recently changed brands of birth control. She has been following up with OB-GYN lately. She denies GI problems such as nausea, vomiting, diarrhea, and constipation. She has no reports of blood in stools, dysuria and hematuria. She denies fevers, chills, fatigue, recent infections, weight loss, and night sweats. She has not had any headaches, visual changes, dizziness, and falls. No chest pain, heart palpitations, cough and shortness of breath reported.  No depression or anxiety reported today. She denies suicidal ideations, homicidal ideations, or auditory hallucinations. She denies pain today.   Family History  Problem Relation Age of Onset  . Other Mother        Healthy  . Other Father        Healthy     Social History   Socioeconomic History  . Marital status: Significant Other    Spouse name: Not on file  . Number of children: Not on file  . Years of education: Not on file  . Highest education level: Not on file  Occupational History  . Not on file  Tobacco Use  . Smoking status: Never Smoker  . Smokeless tobacco: Never Used  Substance and Sexual Activity  . Alcohol use: Yes  . Drug use: No  . Sexual activity: Yes    Birth control/protection: Pill  Other Topics Concern  . Not on file  Social History Narrative  . Not on file   Social Determinants  of Health   Financial Resource Strain:   . Difficulty of Paying Living Expenses: Not on file  Food Insecurity:   . Worried About Programme researcher, broadcasting/film/video in the Last Year: Not on file  . Ran Out of Food in the Last Year: Not on file  Transportation Needs:   . Lack of Transportation (Medical): Not on file  . Lack of Transportation (Non-Medical): Not on file  Physical Activity:   . Days of Exercise per Week: Not on file  . Minutes of Exercise per Session: Not on file  Stress:   . Feeling of Stress : Not on file  Social Connections:   . Frequency of Communication with Friends and Family: Not on file  . Frequency of Social Gatherings with Friends and Family: Not on file  . Attends Religious Services: Not on file  . Active Member of Clubs or Organizations: Not on file  . Attends Banker Meetings: Not on file  . Marital Status: Not on file  Intimate Partner Violence:   . Fear of Current or Ex-Partner: Not on file  . Emotionally Abused: Not on file  . Physically Abused: Not on file  . Sexually Abused: Not on file    Outpatient Medications Prior to Visit  Medication Sig Dispense Refill  . norethindrone-ethinyl estradiol-iron (LOESTRIN FE) 1.5-30 MG-MCG tablet Take 1 tablet by mouth daily. 3 Package 4  .  terconazole (TERAZOL 7) 0.4 % vaginal cream Place 1 applicator vaginally at bedtime. Use for seven days 45 g 0  . valACYclovir (VALTREX) 500 MG tablet Take 1 tablet (500 mg total) by mouth daily. Can increase to twice a day for 5 days in the event of an outbreak 30 tablet 12  . fluconazole (DIFLUCAN) 150 MG tablet Take 1 tablet (150 mg total) by mouth daily. (Patient not taking: Reported on 01/28/2019) 1 tablet 1   No facility-administered medications prior to visit.    No Known Allergies  ROS Review of Systems  Constitutional: Negative.   HENT: Negative.   Eyes: Negative.   Respiratory: Negative.   Cardiovascular: Negative.   Gastrointestinal: Negative.   Endocrine:  Negative.   Genitourinary: Negative.   Musculoskeletal: Negative.   Skin: Negative.   Allergic/Immunologic: Negative.   Neurological: Negative.   Hematological: Negative.   Psychiatric/Behavioral: Negative.    Objective:    Physical Exam  Constitutional: She is oriented to person, place, and time. She appears well-developed and well-nourished.  HENT:  Head: Normocephalic and atraumatic.  Eyes: Conjunctivae are normal.  Cardiovascular: Normal rate, regular rhythm, normal heart sounds and intact distal pulses.  Pulmonary/Chest: Effort normal and breath sounds normal.  Abdominal: Soft. Bowel sounds are normal.  Musculoskeletal:        General: Normal range of motion.     Cervical back: Normal range of motion and neck supple.  Neurological: She is alert and oriented to person, place, and time. She has normal reflexes.  Skin: Skin is warm.  Psychiatric: She has a normal mood and affect. Her behavior is normal. Judgment and thought content normal.  Nursing note and vitals reviewed.   BP 117/70   Pulse 77   Temp 97.7 F (36.5 C)   Ht 5' (1.524 m)   Wt 130 lb 6.4 oz (59.1 kg)   LMP 02/26/2019   SpO2 99%   BMI 25.47 kg/m  Wt Readings from Last 3 Encounters:  03/04/19 130 lb 6.4 oz (59.1 kg)  01/28/19 131 lb (59.4 kg)  12/28/18 130 lb (59 kg)     There are no preventive care reminders to display for this patient.  There are no preventive care reminders to display for this patient.  No results found for: TSH Lab Results  Component Value Date   WBC 6.8 03/23/2018   HGB 12.3 03/23/2018   HCT 35.5 03/23/2018   MCV 85 03/23/2018   PLT 289 03/23/2018   Lab Results  Component Value Date   NA 140 03/23/2018   K 4.1 03/23/2018   CO2 23 03/23/2018   GLUCOSE 86 03/23/2018   BUN 5 (L) 03/23/2018   CREATININE 0.80 03/23/2018   BILITOT 0.4 03/23/2018   ALKPHOS 69 03/23/2018   AST 18 03/23/2018   ALT 8 03/23/2018   PROT 7.0 03/23/2018   ALBUMIN 4.5 03/23/2018   CALCIUM  9.4 03/23/2018   No results found for: CHOL No results found for: HDL No results found for: LDLCALC No results found for: TRIG No results found for: [facility] Lab Results  Component Value Date   HGBA1C 5.0 03/23/2018   Assessment & Plan:   1. Menorrhagia with regular cycle Stable. She will continue to follow up with OB-GYN as needed.   2. Vaginal discharge Frequent. Recommended Probiotics and increasing water intake.   3. Vaginal irritation  4. Nausea Stable.  5. Follow up She will follow up in 6 months.  No orders of the defined types were  placed in this encounter.   No orders of the defined types were placed in this encounter.   Referral Orders  No referral(s) requested today    Kathe Becton,  MSN, FNP-BC [place], [place] [address] [phone] fax  Problem List Items Addressed This Visit      Other   Nausea   Vaginal irritation    Other Visit Diagnoses    Menorrhagia with regular cycle    -  Primary   Vaginal discharge       Follow up          No orders of the defined types were placed in this encounter.   Follow-up: Return in about 6 months (around 09/01/2019).    Azzie Glatter, FNP

## 2019-03-07 ENCOUNTER — Encounter: Payer: Self-pay | Admitting: Obstetrics & Gynecology

## 2019-03-07 ENCOUNTER — Other Ambulatory Visit: Payer: Self-pay

## 2019-03-07 ENCOUNTER — Other Ambulatory Visit (HOSPITAL_COMMUNITY)
Admission: RE | Admit: 2019-03-07 | Discharge: 2019-03-07 | Disposition: A | Discharge status: Home / Self Care | Payer: Federal, State, Local not specified - PPO | Source: Ambulatory Visit | Attending: Obstetrics & Gynecology | Admitting: Obstetrics & Gynecology

## 2019-03-07 ENCOUNTER — Ambulatory Visit (INDEPENDENT_AMBULATORY_CARE_PROVIDER_SITE_OTHER): Payer: Federal, State, Local not specified - PPO | Admitting: Obstetrics & Gynecology

## 2019-03-07 VITALS — BP 107/71 | HR 68 | Temp 98.5°F | Ht 60.0 in | Wt 131.0 lb

## 2019-03-07 DIAGNOSIS — N760 Acute vaginitis: Secondary | ICD-10-CM | POA: Insufficient documentation

## 2019-03-07 DIAGNOSIS — B9689 Other specified bacterial agents as the cause of diseases classified elsewhere: Secondary | ICD-10-CM | POA: Insufficient documentation

## 2019-03-07 DIAGNOSIS — Z3200 Encounter for pregnancy test, result unknown: Secondary | ICD-10-CM

## 2019-03-07 DIAGNOSIS — Z3202 Encounter for pregnancy test, result negative: Secondary | ICD-10-CM | POA: Diagnosis not present

## 2019-03-07 DIAGNOSIS — Z3041 Encounter for surveillance of contraceptive pills: Secondary | ICD-10-CM | POA: Diagnosis not present

## 2019-03-07 DIAGNOSIS — N898 Other specified noninflammatory disorders of vagina: Secondary | ICD-10-CM

## 2019-03-07 LAB — POCT URINE PREGNANCY: Preg Test, Ur: NEGATIVE

## 2019-03-07 NOTE — Progress Notes (Addendum)
   Subjective:    Patient ID: Samantha Foster, female    DOB: 1995/08/15, 24 y.o.   MRN: [id number]  HPI  Pt here for UPT.  She is also c/o vaginal irritation for several weeks.  Pt taking Loestrin Fe and insurance is paying for it.    Review of Systems  Constitutional: Negative.   Respiratory: Negative.   Cardiovascular: Negative.   Gastrointestinal: Negative.   Genitourinary: Negative.        Objective:   Physical Exam Vitals reviewed.  Constitutional:      General: She is not in acute distress.    Appearance: She is well-developed.  HENT:     Head: Normocephalic and atraumatic.  Eyes:     Conjunctiva/sclera: Conjunctivae normal.  Cardiovascular:     Rate and Rhythm: Normal rate.  Pulmonary:     Effort: Pulmonary effort is normal.  Genitourinary:    Comments: [place]anner V No blood in vault Skin:    General: Skin is warm and dry.  Neurological:     Mental Status: She is alert and oriented to person, place, and time.    Vitals:   03/07/19 1317  BP: 107/71  Pulse: 68  Temp: 98.5 F (36.9 C)  Weight: 131 lb (59.4 kg)  Height: 5' (1.524 m)   Assessment & Plan:  24 yo female presents for UPT test and test for vaginal discharge.    1.  UPT 2.  Wet prep shows BV. Will treat with Flagyl.   3.  Continue OCPs

## 2019-03-08 LAB — CERVICOVAGINAL ANCILLARY ONLY
Bacterial Vaginitis (gardnerella): POSITIVE — AB
Candida Glabrata: NEGATIVE
Candida Vaginitis: NEGATIVE
Comment: NEGATIVE
Comment: NEGATIVE
Comment: NEGATIVE

## 2019-03-09 MED ORDER — METRONIDAZOLE 500 MG PO TABS: 500.0000 mg | ORAL_TABLET | Freq: Two times a day (BID) | ORAL | 0 refills | Status: DC

## 2019-03-10 ENCOUNTER — Encounter: Payer: Self-pay | Admitting: Obstetrics & Gynecology

## 2019-03-10 NOTE — Addendum Note (Signed)
Addended by: Lesly Dukes on: 03/10/2019 01:50 PM   Modules accepted: Orders

## 2019-04-26 ENCOUNTER — Ambulatory Visit (INDEPENDENT_AMBULATORY_CARE_PROVIDER_SITE_OTHER): Payer: Federal, State, Local not specified - PPO

## 2019-04-26 ENCOUNTER — Other Ambulatory Visit: Payer: Self-pay

## 2019-04-26 ENCOUNTER — Other Ambulatory Visit (HOSPITAL_COMMUNITY)
Admission: RE | Admit: 2019-04-26 | Discharge: 2019-04-26 | Disposition: A | Discharge status: Home / Self Care | Payer: Federal, State, Local not specified - PPO | Source: Ambulatory Visit | Attending: Advanced Practice Midwife | Admitting: Advanced Practice Midwife

## 2019-04-26 DIAGNOSIS — N898 Other specified noninflammatory disorders of vagina: Secondary | ICD-10-CM | POA: Insufficient documentation

## 2019-04-26 NOTE — Progress Notes (Signed)
Pt c/o vaginal odor. Self swab done and Aptima sent. Pt is aware we will contact her with results.

## 2019-04-27 LAB — CERVICOVAGINAL ANCILLARY ONLY
Bacterial Vaginitis (gardnerella): POSITIVE — AB
Candida Glabrata: NEGATIVE
Candida Vaginitis: NEGATIVE
Chlamydia: NEGATIVE
Comment: NEGATIVE
Comment: NEGATIVE
Comment: NEGATIVE
Comment: NEGATIVE
Comment: NEGATIVE
Comment: NORMAL
Neisseria Gonorrhea: NEGATIVE
Trichomonas: NEGATIVE

## 2019-04-28 ENCOUNTER — Telehealth: Payer: Self-pay

## 2019-04-28 DIAGNOSIS — B9689 Other specified bacterial agents as the cause of diseases classified elsewhere: Secondary | ICD-10-CM

## 2019-04-28 MED ORDER — METRONIDAZOLE 500 MG PO TABS: 500.0000 mg | ORAL_TABLET | Freq: Two times a day (BID) | ORAL | 0 refills | Status: DC

## 2019-04-28 NOTE — Telephone Encounter (Signed)
Pt is aware of positive BV results. Pharmacy verified and Flagyl sent.

## 2019-05-07 DIAGNOSIS — R110 Nausea: Secondary | ICD-10-CM

## 2019-05-07 DIAGNOSIS — R109 Unspecified abdominal pain: Secondary | ICD-10-CM

## 2019-05-07 HISTORY — DX: Unspecified abdominal pain: R10.9

## 2019-05-07 HISTORY — DX: Nausea: R11.0

## 2019-05-09 ENCOUNTER — Ambulatory Visit: Payer: Federal, State, Local not specified - PPO

## 2019-05-10 ENCOUNTER — Other Ambulatory Visit: Payer: Self-pay

## 2019-05-10 ENCOUNTER — Ambulatory Visit (INDEPENDENT_AMBULATORY_CARE_PROVIDER_SITE_OTHER): Payer: Federal, State, Local not specified - PPO

## 2019-05-10 DIAGNOSIS — Z23 Encounter for immunization: Secondary | ICD-10-CM

## 2019-05-10 NOTE — Progress Notes (Signed)
Pt here for Gardasil injection. Injection given in right deltoid and tolerated well.

## 2019-05-30 ENCOUNTER — Other Ambulatory Visit: Payer: Self-pay

## 2019-05-30 ENCOUNTER — Encounter: Payer: Self-pay | Admitting: Family Medicine

## 2019-05-30 ENCOUNTER — Ambulatory Visit (INDEPENDENT_AMBULATORY_CARE_PROVIDER_SITE_OTHER): Payer: Federal, State, Local not specified - PPO | Admitting: Family Medicine

## 2019-05-30 VITALS — BP 103/66 | HR 68 | Temp 98.4°F | Resp 16 | Ht 60.0 in | Wt 131.0 lb

## 2019-05-30 DIAGNOSIS — R1084 Generalized abdominal pain: Secondary | ICD-10-CM

## 2019-05-30 DIAGNOSIS — Z789 Other specified health status: Secondary | ICD-10-CM | POA: Diagnosis not present

## 2019-05-30 DIAGNOSIS — Z3202 Encounter for pregnancy test, result negative: Secondary | ICD-10-CM | POA: Diagnosis not present

## 2019-05-30 DIAGNOSIS — Z09 Encounter for follow-up examination after completed treatment for conditions other than malignant neoplasm: Secondary | ICD-10-CM | POA: Diagnosis not present

## 2019-05-30 DIAGNOSIS — R110 Nausea: Secondary | ICD-10-CM | POA: Diagnosis not present

## 2019-05-30 LAB — POCT URINALYSIS DIPSTICK
Bilirubin, UA: NEGATIVE
Blood, UA: NEGATIVE
Glucose, UA: NEGATIVE
Ketones, UA: NEGATIVE
Leukocytes, UA: NEGATIVE
Nitrite, UA: NEGATIVE
Protein, UA: POSITIVE — AB
Spec Grav, UA: 1.025 (ref 1.010–1.025)
Urobilinogen, UA: 0.2 E.U./dL
pH, UA: 5.5 (ref 5.0–8.0)

## 2019-05-30 LAB — POCT URINE PREGNANCY: Preg Test, Ur: NEGATIVE

## 2019-05-30 MED ORDER — ONDANSETRON HCL 4 MG PO TABS: 4.0000 mg | ORAL_TABLET | Freq: Three times a day (TID) | ORAL | 2 refills | Status: DC | PRN

## 2019-05-30 NOTE — Progress Notes (Signed)
[facility]   Established Patient Office Visit  Subjective:  Patient ID: Samantha Foster, female    DOB: 03-Jun-1995  Age: 24 y.o. MRN: [id number]  CC:  Chief Complaint  Patient presents with  . Sick visit    Stomach pain  . Nausea    HPI Samantha Foster is a 24 year old female who presents for Follow Up today.   Past Medical History:  Diagnosis Date  . Abdominal pain 05/2019  . Nausea 05/2019    Past Surgical History:  Procedure Laterality Date  . WISDOM TOOTH EXTRACTION     Patient Active Problem List   Diagnosis Date Noted  . HSV (herpes simplex virus) infection 02/02/2019  . Vaginal irritation 09/22/2018  . Well woman exam with routine gynecological exam 09/22/2018  . Uses oral contraceptives as primary birth control method 09/01/2018  . History of rectal bleeding 03/23/2018  . Left upper quadrant pain 03/23/2018  . Nausea 03/23/2018  . Diarrhea 03/23/2018  . Constipation 03/23/2018   Current Status: Since her last office visit, she has c/o generalized abdominal pain, with nausea X 2 months now. She states that pain is present all day, but nausea is increased when attempting eat a meal. She has recently discontinued use of oral birth control pills X 3 weeks now. Denies GI problems such as nausea, vomiting, diarrhea, and constipation. She has no reports of blood in stools, dysuria and hematuria. She denies fevers, chills, fatigue, recent infections, weight loss, and night sweats. She has not had any headaches, visual changes, dizziness, and falls. No chest pain, heart palpitations, cough and shortness of breath reported.  No depression or anxiety reported today. She denies suicidal ideations, homicidal ideations, or auditory hallucinations. She is taking all medications as prescribed.   Family History  Problem Relation Age of Onset  . Other Mother        Healthy  . Other Father        Healthy     Social History    Socioeconomic History  . Marital status: Significant Other    Spouse name: Not on file  . Number of children: Not on file  . Years of education: Not on file  . Highest education level: Not on file  Occupational History  . Not on file  Tobacco Use  . Smoking status: Never Smoker  . Smokeless tobacco: Never Used  Substance and Sexual Activity  . Alcohol use: Yes  . Drug use: No  . Sexual activity: Yes    Birth control/protection: Pill  Other Topics Concern  . Not on file  Social History Narrative  . Not on file   Social Determinants of Health   Financial Resource Strain:   . Difficulty of Paying Living Expenses:   Food Insecurity:   . Worried About Programme researcher, broadcasting/film/video in the Last Year:   . Barista in the Last Year:   Transportation Needs:   . Freight forwarder (Medical):   Marland Kitchen Lack of Transportation (Non-Medical):   Physical Activity:   . Days of Exercise per Week:   . Minutes of Exercise per Session:   Stress:   . Feeling of Stress :   Social Connections:   . Frequency of Communication with Friends and Family:   . Frequency of Social Gatherings with Friends and Family:   . Attends Religious Services:   . Active Member of Clubs or Organizations:   . Attends Banker  Meetings:   Marland Kitchen Marital Status:   Intimate Partner Violence:   . Fear of Current or Ex-Partner:   . Emotionally Abused:   Marland Kitchen Physically Abused:   . Sexually Abused:     Outpatient Medications Prior to Visit  Medication Sig Dispense Refill  . norethindrone-ethinyl estradiol-iron (LOESTRIN FE) 1.5-30 MG-MCG tablet Take 1 tablet by mouth daily. (Patient not taking: Reported on 03/07/2019) 3 Package 4  . valACYclovir (VALTREX) 500 MG tablet Take 1 tablet (500 mg total) by mouth daily. Can increase to twice a day for 5 days in the event of an outbreak (Patient not taking: Reported on 03/07/2019) 30 tablet 12  . metroNIDAZOLE (FLAGYL) 500 MG tablet Take 1 tablet (500 mg total) by mouth 2  (two) times daily with a meal. (Patient not taking: Reported on 05/30/2019) 14 tablet 0  . metroNIDAZOLE (FLAGYL) 500 MG tablet Take 1 tablet (500 mg total) by mouth 2 (two) times daily. (Patient not taking: Reported on 05/30/2019) 14 tablet 0   No facility-administered medications prior to visit.    No Known Allergies  ROS Review of Systems  Constitutional: Negative.   HENT: Negative.   Eyes: Negative.   Respiratory: Negative.   Cardiovascular: Negative.   Gastrointestinal: Negative.   Endocrine: Negative.   Genitourinary: Negative.   Musculoskeletal: Negative.   Skin: Negative.   Allergic/Immunologic: Negative.   Neurological: Negative.   Hematological: Negative.   Psychiatric/Behavioral: Negative.       Objective:    Physical Exam  Constitutional: She is oriented to person, place, and time. She appears well-developed and well-nourished.  HENT:  Head: Normocephalic and atraumatic.  Eyes: Conjunctivae are normal.  Cardiovascular: Normal rate, regular rhythm, normal heart sounds and intact distal pulses.  Pulmonary/Chest: Effort normal and breath sounds normal.  Abdominal: Soft. Bowel sounds are normal.  Musculoskeletal:        General: Normal range of motion.     Cervical back: Normal range of motion and neck supple.  Neurological: She is alert and oriented to person, place, and time. She has normal reflexes.  Skin: Skin is warm and dry.  Psychiatric: She has a normal mood and affect. Her behavior is normal. Judgment and thought content normal.  Nursing note and vitals reviewed.   BP 103/66 (BP Location: Left Arm, Patient Position: Sitting, Cuff Size: Small)   Pulse 68   Temp 98.4 F (36.9 C)   Resp 16   Ht 5' (1.524 m)   Wt 131 lb (59.4 kg)   SpO2 100%   BMI 25.58 kg/m  Wt Readings from Last 3 Encounters:  05/30/19 131 lb (59.4 kg)  03/07/19 131 lb (59.4 kg)  03/04/19 130 lb 6.4 oz (59.1 kg)     Health Maintenance Due  Topic Date Due  . TETANUS/TDAP   Never done    There are no preventive care reminders to display for this patient.  No results found for: TSH Lab Results  Component Value Date   WBC 6.8 03/23/2018   HGB 12.3 03/23/2018   HCT 35.5 03/23/2018   MCV 85 03/23/2018   PLT 289 03/23/2018   Lab Results  Component Value Date   NA 140 03/23/2018   K 4.1 03/23/2018   CO2 23 03/23/2018   GLUCOSE 86 03/23/2018   BUN 5 (L) 03/23/2018   CREATININE 0.80 03/23/2018   BILITOT 0.4 03/23/2018   ALKPHOS 69 03/23/2018   AST 18 03/23/2018   ALT 8 03/23/2018   PROT 7.0 03/23/2018  ALBUMIN 4.5 03/23/2018   CALCIUM 9.4 03/23/2018   No results found for: CHOL No results found for: HDL No results found for: LDLCALC No results found for: TRIG No results found for: [facility] Lab Results  Component Value Date   HGBA1C 5.0 03/23/2018      Assessment & Plan:   1. Generalized abdominal pain Pregnancy test is negative today. Results of H. Pylori testing is negative.  - POCT urinalysis dipstick - POCT urine pregnancy - ondansetron (ZOFRAN) 4 MG tablet; Take 1 tablet (4 mg total) by mouth every 8 (eight) hours as needed for nausea or vomiting.  Dispense: 20 tablet; Refill: 2 - H. pylori breath test  2. Nausea - ondansetron (ZOFRAN) 4 MG tablet; Take 1 tablet (4 mg total) by mouth every 8 (eight) hours as needed for nausea or vomiting.  Dispense: 20 tablet; Refill: 2 - H. pylori breath test  3. Uses oral contraceptives as primary birth control method She is no longer on birth control. Written material on contraception methods given to patient at discharge today.   4. Follow up She will follow up in 3 months.   Meds ordered this encounter  Medications  . ondansetron (ZOFRAN) 4 MG tablet    Sig: Take 1 tablet (4 mg total) by mouth every 8 (eight) hours as needed for nausea or vomiting.    Dispense:  20 tablet    Refill:  2    Orders Placed This Encounter  Procedures  . H. pylori breath test  . POCT urinalysis dipstick   . POCT urine pregnancy    Referral Orders  No referral(s) requested today    Raliegh Ip,  MSN, FNP-BC [facility] [address] [place], [place] [address] [phone] fax   Problem List Items Addressed This Visit      Other   Nausea   Relevant Medications   ondansetron (ZOFRAN) 4 MG tablet   Other Relevant Orders   H. pylori breath test (Completed)   Uses oral contraceptives as primary birth control method    Other Visit Diagnoses    Generalized abdominal pain    -  Primary   Relevant Medications   ondansetron (ZOFRAN) 4 MG tablet   Other Relevant Orders   POCT urinalysis dipstick (Completed)   POCT urine pregnancy (Completed)   H. pylori breath test (Completed)   Follow up          Meds ordered this encounter  Medications  . ondansetron (ZOFRAN) 4 MG tablet    Sig: Take 1 tablet (4 mg total) by mouth every 8 (eight) hours as needed for nausea or vomiting.    Dispense:  20 tablet    Refill:  2    Follow-up: No follow-ups on file.    Kallie Locks, FNP

## 2019-06-01 ENCOUNTER — Encounter: Payer: Self-pay | Admitting: Family Medicine

## 2019-06-01 LAB — H. PYLORI BREATH TEST: H pylori Breath Test: NEGATIVE

## 2019-06-29 ENCOUNTER — Ambulatory Visit (INDEPENDENT_AMBULATORY_CARE_PROVIDER_SITE_OTHER): Payer: Federal, State, Local not specified - PPO | Admitting: Obstetrics and Gynecology

## 2019-06-29 ENCOUNTER — Encounter: Payer: Self-pay | Admitting: Obstetrics and Gynecology

## 2019-06-29 ENCOUNTER — Other Ambulatory Visit: Payer: Self-pay

## 2019-06-29 ENCOUNTER — Other Ambulatory Visit (HOSPITAL_COMMUNITY)
Admission: RE | Admit: 2019-06-29 | Discharge: 2019-06-29 | Disposition: A | Discharge status: Home / Self Care | Payer: Federal, State, Local not specified - PPO | Source: Ambulatory Visit | Attending: Obstetrics and Gynecology | Admitting: Obstetrics and Gynecology

## 2019-06-29 VITALS — BP 105/72 | HR 73 | Resp 16 | Ht 65.0 in | Wt 135.0 lb

## 2019-06-29 DIAGNOSIS — Z113 Encounter for screening for infections with a predominantly sexual mode of transmission: Secondary | ICD-10-CM | POA: Insufficient documentation

## 2019-06-29 DIAGNOSIS — B373 Candidiasis of vulva and vagina: Secondary | ICD-10-CM | POA: Diagnosis not present

## 2019-06-29 DIAGNOSIS — B3731 Acute candidiasis of vulva and vagina: Secondary | ICD-10-CM | POA: Insufficient documentation

## 2019-06-29 MED ORDER — NYSTATIN-TRIAMCINOLONE 100000-0.1 UNIT/GM-% EX OINT
1.0000 | TOPICAL_OINTMENT | Freq: Two times a day (BID) | CUTANEOUS | 0 refills | Status: DC
Start: 2019-06-29 — End: 2019-07-19

## 2019-06-29 MED ORDER — FLUCONAZOLE 150 MG PO TABS
150.0000 mg | ORAL_TABLET | Freq: Every day | ORAL | 0 refills | Status: DC
Start: 2019-06-29 — End: 2019-07-19

## 2019-06-29 NOTE — Progress Notes (Signed)
GYNECOLOGY ANNUAL PREVENTATIVE CARE ENCOUNTER NOTE  History:     Samantha Foster is a 24 y.o. G0P0 female here for Vaginal irritation. States the symptoms started 1 week ago. She tried OTC monistat x 3 days and vaginal boric acid for 1 week. No improvement. She has noticed erythema. No painful intercourse, however noticed some swelling after. No fever. No abdominal pain. 1 partner x 1 year. Has never had a pap smear.   Obstetric History OB History  Gravida Para Term Preterm AB Living  0            SAB TAB Ectopic Multiple Live Births               Past Medical History:  Diagnosis Date  . Abdominal pain 05/2019  . Nausea 05/2019    Past Surgical History:  Procedure Laterality Date  . WISDOM TOOTH EXTRACTION      Current Outpatient Medications on File Prior to Visit  Medication Sig Dispense Refill  . norethindrone-ethinyl estradiol-iron (LOESTRIN FE) 1.5-30 MG-MCG tablet Take 1 tablet by mouth daily. 3 Package 4  . ondansetron (ZOFRAN) 4 MG tablet Take 1 tablet (4 mg total) by mouth every 8 (eight) hours as needed for nausea or vomiting. (Patient not taking: Reported on 06/29/2019) 20 tablet 2  . valACYclovir (VALTREX) 500 MG tablet Take 1 tablet (500 mg total) by mouth daily. Can increase to twice a day for 5 days in the event of an outbreak (Patient not taking: Reported on 03/07/2019) 30 tablet 12   No current facility-administered medications on file prior to visit.    No Known Allergies  Social History:  reports that she has never smoked. She has never used smokeless tobacco. She reports current alcohol use. She reports that she does not use drugs.  Family History  Problem Relation Age of Onset  . Other Mother        Healthy  . Other Father        Healthy     The following portions of the patient's history were reviewed and updated as appropriate: allergies, current medications, past family history, past medical history, past social history, past surgical history  and problem list.  Review of Systems Pertinent items noted in HPI and remainder of comprehensive ROS otherwise negative.  Physical Exam:  BP 105/72   Pulse 73   Resp 16   Ht 5\' 5"  (1.651 m)   Wt 135 lb (61.2 kg)   LMP 06/15/2019   BMI 22.47 kg/m  CONSTITUTIONAL: Well-developed, well-nourished female in no acute distress.  HENT:  Normocephalic. MUSCULOSKELETAL: Normal range of motion. No tenderness.   NEUROLOGIC: Alert and oriented to person, place, and time. PSYCHIATRIC: Normal mood and affect. Normal behavior. ABDOMEN: Soft, no distention noted.  No tenderness, rebound or guarding.  PELVIC: Normal appearing external genitalia and urethral meatus; mild erythema. Small amount of think white discharge on vaginal wall, and cervix.    Assessment and Plan:  1. Screening examination for STD (sexually transmitted disease)  - HIV antibody (with reflex) - RPR - Hepatitis C Antibody - Hepatitis B Surface AntiGEN - Cervicovaginal ancillary only( [facility]) - Rx Diflucan and Mycolog x 48 hours.  - Return if symptoms do not improve.  -Schedule annual with pap  2. Vaginal yeast infection    Routine preventative health maintenance measures emphasized. Please refer to After Visit Summary for other counseling recommendations.    Tillmon Kisling, 08/15/2019, NP [facility] for [facility]

## 2019-06-30 LAB — HEPATITIS C ANTIBODY
Hepatitis C Ab: NONREACTIVE
SIGNAL TO CUT-OFF: 0.07 (ref <1.00)

## 2019-06-30 LAB — RPR: RPR Ser Ql: NONREACTIVE

## 2019-06-30 LAB — HEPATITIS B SURFACE ANTIGEN: Hepatitis B Surface Ag: NONREACTIVE

## 2019-06-30 LAB — HIV ANTIBODY (ROUTINE TESTING W REFLEX): HIV 1&2 Ab, 4th Generation: NONREACTIVE

## 2019-06-30 LAB — CERVICOVAGINAL ANCILLARY ONLY
Bacterial Vaginitis (gardnerella): NEGATIVE
Candida Glabrata: NEGATIVE
Candida Vaginitis: POSITIVE — AB
Chlamydia: NEGATIVE
Comment: NEGATIVE
Comment: NEGATIVE
Comment: NEGATIVE
Comment: NEGATIVE
Comment: NEGATIVE
Comment: NORMAL
Neisseria Gonorrhea: NEGATIVE
Trichomonas: NEGATIVE

## 2019-07-15 ENCOUNTER — Ambulatory Visit: Payer: Federal, State, Local not specified - PPO | Admitting: Family Medicine

## 2019-07-19 ENCOUNTER — Ambulatory Visit (INDEPENDENT_AMBULATORY_CARE_PROVIDER_SITE_OTHER): Payer: Federal, State, Local not specified - PPO | Admitting: Family Medicine

## 2019-07-19 ENCOUNTER — Other Ambulatory Visit: Payer: Self-pay

## 2019-07-19 VITALS — BP 105/61 | HR 65 | Temp 97.3°F | Resp 16 | Ht 60.0 in | Wt 137.4 lb

## 2019-07-19 DIAGNOSIS — Z0000 Encounter for general adult medical examination without abnormal findings: Secondary | ICD-10-CM | POA: Diagnosis not present

## 2019-07-19 DIAGNOSIS — Z09 Encounter for follow-up examination after completed treatment for conditions other than malignant neoplasm: Secondary | ICD-10-CM

## 2019-07-19 DIAGNOSIS — R1084 Generalized abdominal pain: Secondary | ICD-10-CM

## 2019-07-19 DIAGNOSIS — R1012 Left upper quadrant pain: Secondary | ICD-10-CM

## 2019-07-19 DIAGNOSIS — R110 Nausea: Secondary | ICD-10-CM

## 2019-07-19 LAB — POCT GLYCOSYLATED HEMOGLOBIN (HGB A1C)
HbA1c POC (<> result, manual entry): 4.9 % (ref 4.0–5.6)
HbA1c, POC (controlled diabetic range): 4.9 % (ref 0.0–7.0)
HbA1c, POC (prediabetic range): 4.9 % — AB (ref 5.7–6.4)
Hemoglobin A1C: 4.9 % (ref 4.0–5.6)

## 2019-07-19 LAB — POCT URINALYSIS DIPSTICK
Bilirubin, UA: NEGATIVE
Blood, UA: NEGATIVE
Glucose, UA: NEGATIVE
Ketones, UA: NEGATIVE
Leukocytes, UA: NEGATIVE
Nitrite, UA: NEGATIVE
Protein, UA: NEGATIVE
Spec Grav, UA: 1.025 (ref 1.010–1.025)
Urobilinogen, UA: 0.2 E.U./dL
pH, UA: 6 (ref 5.0–8.0)

## 2019-07-19 LAB — GLUCOSE, POCT (MANUAL RESULT ENTRY): POC Glucose: 86 mg/dl (ref 70–99)

## 2019-07-19 NOTE — Progress Notes (Signed)
[facility]  Established Patient Office Visit  Subjective:  Patient ID: Samantha Foster, female    DOB: 02/19/1995  Age: 24 y.o. MRN: [id number]  CC:  Chief Complaint  Patient presents with  . Gastroesophageal Reflux    Pt states she is here because she can't have a BM she feels nauesa, can't all her food. Pt states she can only eat 3 bites and she feels full.X81months. Pt states she had a BM 3 days ago and it was loose. PT states she didn't take anything.    HPI Samantha Foster is a 24 year old female who presents for Follow Up today.    Patient Active Problem List   Diagnosis Date Noted  . Vaginal yeast infection 06/29/2019  . Screening examination for STD (sexually transmitted disease) 06/29/2019  . HSV (herpes simplex virus) infection 02/02/2019  . Vaginal irritation 09/22/2018  . Well woman exam with routine gynecological exam 09/22/2018  . Uses oral contraceptives as primary birth control method 09/01/2018  . History of rectal bleeding 03/23/2018  . Left upper quadrant pain 03/23/2018  . Nausea 03/23/2018  . Diarrhea 03/23/2018  . Constipation 03/23/2018   Current Status: Since her last office visit, she continues to have problems with eating and pain left abdominal quadrant. Denies GI problems such as nausea, vomiting, diarrhea, and constipation. She has no reports of blood in stools, dysuria and hematuria. She denies fevers, chills, fatigue, recent infections, weight loss, and night sweats. She has not had any headaches, visual changes, dizziness, and falls. No chest pain, heart palpitations, cough and shortness of breath reported. No depression or anxiety, and denies suicidal ideations, homicidal ideations, or auditory hallucinations. She is taking all medications as prescribed. She denies pain today.   Past Medical History:  Diagnosis Date  . Abdominal pain 05/2019  . Nausea 05/2019    Past Surgical History:    Procedure Laterality Date  . WISDOM TOOTH EXTRACTION      Family History  Problem Relation Age of Onset  . Other Mother        Healthy  . Other Father        Healthy     Social History   Socioeconomic History  . Marital status: Significant Other    Spouse name: Not on file  . Number of children: Not on file  . Years of education: Not on file  . Highest education level: Not on file  Occupational History  . Not on file  Tobacco Use  . Smoking status: Never Smoker  . Smokeless tobacco: Never Used  Vaping Use  . Vaping Use: Never used  Substance and Sexual Activity  . Alcohol use: Yes  . Drug use: No  . Sexual activity: Yes    Birth control/protection: Pill  Other Topics Concern  . Not on file  Social History Narrative  . Not on file   Social Determinants of Health   Financial Resource Strain:   . Difficulty of Paying Living Expenses:   Food Insecurity:   . Worried About Programme researcher, broadcasting/film/video in the Last Year:   . Barista in the Last Year:   Transportation Needs:   . Freight forwarder (Medical):   Marland Kitchen Lack of Transportation (Non-Medical):   Physical Activity:   . Days of Exercise per Week:   . Minutes of Exercise per Session:   Stress:   . Feeling of Stress :   Social Connections:   .  Frequency of Communication with Friends and Family:   . Frequency of Social Gatherings with Friends and Family:   . Attends Religious Services:   . Active Member of Clubs or Organizations:   . Attends Banker Meetings:   Marland Kitchen Marital Status:   Intimate Partner Violence:   . Fear of Current or Ex-Partner:   . Emotionally Abused:   Marland Kitchen Physically Abused:   . Sexually Abused:     Outpatient Medications Prior to Visit  Medication Sig Dispense Refill  . ondansetron (ZOFRAN) 4 MG tablet Take 1 tablet (4 mg total) by mouth every 8 (eight) hours as needed for nausea or vomiting. 20 tablet 2  . valACYclovir (VALTREX) 500 MG tablet Take 1 tablet (500 mg total) by  mouth daily. Can increase to twice a day for 5 days in the event of an outbreak 30 tablet 12  . fluconazole (DIFLUCAN) 150 MG tablet Take 1 tablet (150 mg total) by mouth daily. Take one dose today and repeat in 3 days. (Patient not taking: Reported on 07/19/2019) 2 tablet 0  . norethindrone-ethinyl estradiol-iron (LOESTRIN FE) 1.5-30 MG-MCG tablet Take 1 tablet by mouth daily. (Patient not taking: Reported on 07/19/2019) 3 Package 4  . nystatin-triamcinolone ointment (MYCOLOG) Apply 1 application topically 2 (two) times daily. 2-3 times daily as needed, x 48 hours. (Patient not taking: Reported on 07/19/2019) 30 g 0   No facility-administered medications prior to visit.    No Known Allergies  ROS Review of Systems  Constitutional: Negative.   HENT: Negative.   Eyes: Negative.   Respiratory: Negative.   Cardiovascular: Negative.   Gastrointestinal: Negative.   Endocrine: Negative.   Genitourinary: Negative.   Musculoskeletal: Negative.   Skin: Negative.   Allergic/Immunologic: Negative.   Neurological: Negative.   Hematological: Negative.   Psychiatric/Behavioral: Negative.       Objective:    Physical Exam Vitals and nursing note reviewed.  Constitutional:      Appearance: Normal appearance.  HENT:     Head: Normocephalic and atraumatic.     Nose: Nose normal.     Mouth/Throat:     Mouth: Mucous membranes are moist.     Pharynx: Oropharynx is clear.  Cardiovascular:     Rate and Rhythm: Normal rate and regular rhythm.     Pulses: Normal pulses.     Heart sounds: Normal heart sounds.  Pulmonary:     Effort: Pulmonary effort is normal.     Breath sounds: Normal breath sounds.  Abdominal:     General: Bowel sounds are normal.     Palpations: Abdomen is soft.  Musculoskeletal:        General: Normal range of motion.     Cervical back: Normal range of motion and neck supple.  Skin:    General: Skin is warm and dry.  Neurological:     General: No focal deficit  present.     Mental Status: She is alert and oriented to person, place, and time.  Psychiatric:        Mood and Affect: Mood normal.        Thought Content: Thought content normal.        Judgment: Judgment normal.     BP 105/61 (BP Location: Left Arm, Patient Position: Sitting, Cuff Size: Normal)   Pulse 65   Temp (!) 97.3 F (36.3 C)   Resp 16   Ht 5' (1.524 m)   Wt 137 lb 6.4 oz (62.3 kg)   LMP  07/11/2019 (Approximate)   SpO2 100%   BMI 26.83 kg/m  Wt Readings from Last 3 Encounters:  07/19/19 137 lb 6.4 oz (62.3 kg)  06/29/19 135 lb (61.2 kg)  05/30/19 131 lb (59.4 kg)     Health Maintenance Due  Topic Date Due  . TETANUS/TDAP  Never done    There are no preventive care reminders to display for this patient.  No results found for: TSH Lab Results  Component Value Date   WBC 5.5 07/19/2019   HGB 13.7 07/19/2019   HCT 41.3 07/19/2019   MCV 88 07/19/2019   PLT 262 07/19/2019   Lab Results  Component Value Date   NA 140 07/19/2019   K 4.6 07/19/2019   CO2 24 07/19/2019   GLUCOSE 80 07/19/2019   BUN 9 07/19/2019   CREATININE 0.93 07/19/2019   BILITOT 0.6 07/19/2019   ALKPHOS 69 07/19/2019   AST 20 07/19/2019   ALT 13 07/19/2019   PROT 7.4 07/19/2019   ALBUMIN 4.8 07/19/2019   CALCIUM 9.8 07/19/2019   No results found for: CHOL No results found for: HDL No results found for: LDLCALC No results found for: TRIG No results found for: CHOLHDL Lab Results  Component Value Date   HGBA1C 4.9 07/19/2019   HGBA1C 4.9 07/19/2019   HGBA1C 4.9 (A) 07/19/2019   HGBA1C 4.9 07/19/2019      Assessment & Plan:   1. Nausea Stable today.  - Urinalysis Dipstick - POC Glucose (CBG) - POC HgB A1c - Ambulatory referral to Gastroenterology - US Abdomen Complete; Future  2. Left upper quadrant pain - Ambulatory referral to Gastroenterology - US Abdomen Complete; Future  3. Generalized abdominal pain  4. Healthcare maintenance - CBC with Differential -  Comprehensive metabolic panel; Future - Comprehensive metabolic panel  5. Follow up She will follow up in 6 months.    No orders of the defined types were placed in this encounter.   Orders Placed This Encounter  Procedures  . US Abdomen Complete  . CBC with Differential  . Comprehensive metabolic panel  . Ambulatory referral to Gastroenterology  . Urinalysis Dipstick  . POC Glucose (CBG)  . POC HgB A1c     Referral Orders     Ambulatory referral to Gastroenterology   Raliegh Ip,  MSN, FNP-BC [facility]/Internal Medicine/[facility] [address] [place], [place] [address] [phone] fax  Problem List Items Addressed This Visit      Other   Left upper quadrant pain   Relevant Orders   Ambulatory referral to Gastroenterology   US Abdomen Complete   Nausea - Primary   Relevant Orders   Urinalysis Dipstick (Completed)   POC Glucose (CBG) (Completed)   POC HgB A1c (Completed)   Ambulatory referral to Gastroenterology   US Abdomen Complete    Other Visit Diagnoses    Generalized abdominal pain       Healthcare maintenance       Relevant Orders   CBC with Differential (Completed)   Comprehensive metabolic panel (Completed)   Follow up          No orders of the defined types were placed in this encounter.   Follow-up: Return in about 6 months (around 01/19/2020).    Kallie Locks, FNP

## 2019-07-20 LAB — COMPREHENSIVE METABOLIC PANEL
ALT: 13 IU/L (ref 0–32)
AST: 20 IU/L (ref 0–40)
Albumin/Globulin Ratio: 1.8 (ref 1.2–2.2)
Albumin: 4.8 g/dL (ref 3.9–5.0)
Alkaline Phosphatase: 69 IU/L (ref 48–121)
BUN/Creatinine Ratio: 10 (ref 9–23)
BUN: 9 mg/dL (ref 6–20)
Bilirubin Total: 0.6 mg/dL (ref 0.0–1.2)
CO2: 24 mmol/L (ref 20–29)
Calcium: 9.8 mg/dL (ref 8.7–10.2)
Chloride: 103 mmol/L (ref 96–106)
Creatinine, Ser: 0.93 mg/dL (ref 0.57–1.00)
GFR calc Af Amer: 101 mL/min/{1.73_m2} (ref >59)
GFR calc non Af Amer: 88 mL/min/{1.73_m2} (ref >59)
Globulin, Total: 2.6 g/dL (ref 1.5–4.5)
Glucose: 80 mg/dL (ref 65–99)
Potassium: 4.6 mmol/L (ref 3.5–5.2)
Sodium: 140 mmol/L (ref 134–144)
Total Protein: 7.4 g/dL (ref 6.0–8.5)

## 2019-07-20 LAB — CBC WITH DIFFERENTIAL/PLATELET
Basophils Absolute: 0 10*3/uL (ref 0.0–0.2)
Basos: 1 %
EOS (ABSOLUTE): 0.1 10*3/uL (ref 0.0–0.4)
Eos: 2 %
Hematocrit: 41.3 % (ref 34.0–46.6)
Hemoglobin: 13.7 g/dL (ref 11.1–15.9)
Immature Grans (Abs): 0 10*3/uL (ref 0.0–0.1)
Immature Granulocytes: 0 %
Lymphocytes Absolute: 1.6 10*3/uL (ref 0.7–3.1)
Lymphs: 30 %
MCH: 29.3 pg (ref 26.6–33.0)
MCHC: 33.2 g/dL (ref 31.5–35.7)
MCV: 88 fL (ref 79–97)
Monocytes Absolute: 0.3 10*3/uL (ref 0.1–0.9)
Monocytes: 6 %
Neutrophils Absolute: 3.4 10*3/uL (ref 1.4–7.0)
Neutrophils: 61 %
Platelets: 262 10*3/uL (ref 150–450)
RBC: 4.68 x10E6/uL (ref 3.77–5.28)
RDW: 12.6 % (ref 11.7–15.4)
WBC: 5.5 10*3/uL (ref 3.4–10.8)

## 2019-07-21 ENCOUNTER — Encounter: Payer: Self-pay | Admitting: Gastroenterology

## 2019-07-22 ENCOUNTER — Encounter: Payer: Self-pay | Admitting: Family Medicine

## 2019-07-22 DIAGNOSIS — R1084 Generalized abdominal pain: Secondary | ICD-10-CM | POA: Insufficient documentation

## 2019-07-28 NOTE — Progress Notes (Signed)
Pt was called @ 3:46 pm to discuss her lab results. Pt stated she understood and will keep her f/u appt.

## 2019-08-02 ENCOUNTER — Ambulatory Visit: Payer: Federal, State, Local not specified - PPO | Admitting: Advanced Practice Midwife

## 2019-08-24 ENCOUNTER — Encounter (HOSPITAL_COMMUNITY): Payer: Self-pay | Admitting: Emergency Medicine

## 2019-08-24 ENCOUNTER — Other Ambulatory Visit: Payer: Self-pay

## 2019-08-24 ENCOUNTER — Ambulatory Visit (HOSPITAL_COMMUNITY)
Admission: EM | Admit: 2019-08-24 | Discharge: 2019-08-24 | Disposition: A | Discharge status: Home / Self Care | Payer: Federal, State, Local not specified - PPO | Attending: Family Medicine | Admitting: Family Medicine

## 2019-08-24 ENCOUNTER — Ambulatory Visit (INDEPENDENT_AMBULATORY_CARE_PROVIDER_SITE_OTHER): Payer: Federal, State, Local not specified - PPO

## 2019-08-24 DIAGNOSIS — J069 Acute upper respiratory infection, unspecified: Secondary | ICD-10-CM | POA: Diagnosis not present

## 2019-08-24 DIAGNOSIS — Z77120 Contact with and (suspected) exposure to mold (toxic): Secondary | ICD-10-CM | POA: Diagnosis not present

## 2019-08-24 DIAGNOSIS — Z20822 Contact with and (suspected) exposure to covid-19: Secondary | ICD-10-CM | POA: Insufficient documentation

## 2019-08-24 DIAGNOSIS — R079 Chest pain, unspecified: Secondary | ICD-10-CM | POA: Diagnosis not present

## 2019-08-24 DIAGNOSIS — R0789 Other chest pain: Secondary | ICD-10-CM

## 2019-08-24 LAB — SARS CORONAVIRUS 2 (TAT 6-24 HRS): SARS Coronavirus 2: NEGATIVE

## 2019-08-24 MED ORDER — CETIRIZINE HCL 10 MG PO TABS: 10.0000 mg | ORAL_TABLET | Freq: Every day | ORAL | 0 refills | Status: AC

## 2019-08-24 NOTE — ED Provider Notes (Signed)
[facility]    CSN: [id number] Arrival date & time: 08/24/19  1053      History   Chief Complaint Chief Complaint  Patient presents with  . Chest Pain    HPI Samantha Foster is a 24 y.o. female.   Samantha Foster presents with complaints of a few days of dry throat when she wakes, chest pain, occasional cough. Has noted mold in her apartment which she is concerned about. Some body soreness which may have been from working out. Pain to chest with breathing, but it is worse when she is in the apartment. This started three days ago. No fevers. Some headache. Non- productive cough. No shortness of breath. Feels congested. No leg pain or swelling. Went to [place] two weeks ago, by plane. Doesn't smoke. She does take birth control pills. No personal or family history of blood clots. No dizziness. Hasn't taken any medications for symptoms. Denies any previous similar in the past. No known ill contacts. Works in [employer]. Has received her covid vaccine series. No asthma history.     ROS per HPI, negative if not otherwise mentioned.      Past Medical History:  Diagnosis Date  . Abdominal pain 05/2019  . Nausea 05/2019    Patient Active Problem List   Diagnosis Date Noted  . Generalized abdominal pain 07/22/2019  . Vaginal yeast infection 06/29/2019  . Screening examination for STD (sexually transmitted disease) 06/29/2019  . HSV (herpes simplex virus) infection 02/02/2019  . Vaginal irritation 09/22/2018  . Well woman exam with routine gynecological exam 09/22/2018  . Uses oral contraceptives as primary birth control method 09/01/2018  . History of rectal bleeding 03/23/2018  . Left upper quadrant pain 03/23/2018  . Nausea 03/23/2018  . Diarrhea 03/23/2018  . Constipation 03/23/2018    Past Surgical History:  Procedure Laterality Date  . WISDOM TOOTH EXTRACTION      OB History    Gravida  0   Para      Term      Preterm      AB      Living          SAB      TAB      Ectopic      Multiple      Live Births               Home Medications    Prior to Admission medications   Medication Sig Start Date End Date Taking? Authorizing Provider  cetirizine (ZYRTEC) 10 MG tablet Take 1 tablet (10 mg total) by mouth daily. 08/24/19   Georgetta Haber, NP  ondansetron (ZOFRAN) 4 MG tablet Take 1 tablet (4 mg total) by mouth every 8 (eight) hours as needed for nausea or vomiting. Patient not taking: Reported on 08/24/2019 05/30/19   Kallie Locks, FNP  valACYclovir (VALTREX) 500 MG tablet Take 1 tablet (500 mg total) by mouth daily. Can increase to twice a day for 5 days in the event of an outbreak Patient not taking: Reported on 08/24/2019 02/02/19   Sharyon Cable, CNM    Family History Family History  Problem Relation Age of Onset  . Other Mother        Healthy  . Other Father        Healthy     Social History Social History   Tobacco Use  . Smoking status: Never Smoker  . Smokeless tobacco: Never Used  Vaping Use  .  Vaping Use: Never used  Substance Use Topics  . Alcohol use: Yes  . Drug use: No     Allergies   Patient has no known allergies.   Review of Systems Review of Systems   Physical Exam Triage Vital Signs ED Triage Vitals  Enc Vitals Group     BP 08/24/19 1235 103/71     Pulse Rate 08/24/19 1235 71     Resp 08/24/19 1235 18     Temp 08/24/19 1235 98.6 F (37 C)     Temp src --      SpO2 08/24/19 1235 100 %     Weight --      Height --      Head Circumference --      Peak Flow --      Pain Score 08/24/19 1233 9     Pain Loc --      Pain Edu? --      Excl. in GC? --    No data found.  Updated Vital Signs BP 103/71 (BP Location: Right Arm)   Pulse 71   Temp 98.6 F (37 C)   Resp 18   LMP 08/15/2019   SpO2 100%   Visual Acuity Right Eye Distance:   Left Eye Distance:   Bilateral Distance:    Right Eye Near:   Left Eye Near:    Bilateral Near:     Physical  Exam Constitutional:      General: She is not in acute distress.    Appearance: She is well-developed.  Cardiovascular:     Rate and Rhythm: Normal rate.  Pulmonary:     Effort: Pulmonary effort is normal.  Musculoskeletal:     Comments: Calves are soft and non tender  Skin:    General: Skin is warm and dry.  Neurological:     Mental Status: She is alert and oriented to person, place, and time.      UC Treatments / Results  Labs (all labs ordered are listed, but only abnormal results are displayed) Labs Reviewed  SARS CORONAVIRUS 2 (TAT 6-24 HRS)    EKG   Radiology DG Chest 2 View  Result Date: 08/24/2019 CLINICAL DATA:  Chest pain EXAM: CHEST - 2 VIEW COMPARISON:  None. FINDINGS: Lungs are clear. Heart size and pulmonary vascularity are normal. No adenopathy. No pneumothorax. No bone lesions. IMPRESSION: No abnormality noted. Electronically Signed   By: Bretta Bang III M.D.   On: 08/24/2019 13:37    Procedures Procedures (including critical care time)  Medications Ordered in UC Medications - No data to display  Initial Impression / Assessment and Plan / UC Course  I have reviewed the triage vital signs and the nursing notes.  Pertinent labs & imaging results that were available during my care of the patient were reviewed by me and considered in my medical decision making (see chart for details).     Non toxic. Benign physical exam.  cxr without acute findings. Allergic vs viral symptoms. Zyrtec recommended. covid testing pending as well. Return precautions provided. Patient verbalized understanding and agreeable to plan.  Ambulatory out of clinic without difficulty.    Final Clinical Impressions(s) / UC Diagnoses   Final diagnoses:  Acute upper respiratory infection  Mold exposure     Discharge Instructions     Chest xray looks well today.  Push fluids to ensure adequate hydration and keep secretions thin.  Tylenol and/or ibuprofen as needed for  pain or fevers.  Over the counter  medications to help with symptoms as needed, such as mucinex.  Start daily zyrtec.  Self isolate until covid results are back and negative.  Will notify you by phone of any positive findings. Your negative results will be sent through your MyChart.     Please return for any worsening of symptoms- pain, shortness of breath , fevers, difficulty breathing    ED Prescriptions    Medication Sig Dispense Auth. Provider   cetirizine (ZYRTEC) 10 MG tablet Take 1 tablet (10 mg total) by mouth daily. 30 tablet Georgetta Haber, NP     PDMP not reviewed this encounter.   Georgetta Haber, NP 08/24/19 1345

## 2019-08-24 NOTE — Discharge Instructions (Addendum)
Chest xray looks well today.  Push fluids to ensure adequate hydration and keep secretions thin.  Tylenol and/or ibuprofen as needed for pain or fevers.   Over the counter  medications to help with symptoms as needed, such as mucinex.  Start daily zyrtec.  Self isolate until covid results are back and negative.  Will notify you by phone of any positive findings. Your negative results will be sent through your MyChart.     Please return for any worsening of symptoms- pain, shortness of breath , fevers, difficulty breathing

## 2019-08-24 NOTE — ED Triage Notes (Signed)
Pt presents with chest pain, headaches, and sore throat. States believes there is mold in her apartment. States symptoms are worst when at home. States when she is not at home, symptoms are not as severe. States symptoms started 3 days ago. Moved into apartment in May.   Denies n,v,d, or fever.

## 2019-09-01 ENCOUNTER — Other Ambulatory Visit (INDEPENDENT_AMBULATORY_CARE_PROVIDER_SITE_OTHER): Payer: Federal, State, Local not specified - PPO

## 2019-09-01 ENCOUNTER — Encounter: Payer: Self-pay | Admitting: Gastroenterology

## 2019-09-01 ENCOUNTER — Ambulatory Visit (INDEPENDENT_AMBULATORY_CARE_PROVIDER_SITE_OTHER): Payer: Federal, State, Local not specified - PPO | Admitting: Gastroenterology

## 2019-09-01 VITALS — BP 100/68 | HR 83 | Ht 60.0 in | Wt 130.2 lb

## 2019-09-01 DIAGNOSIS — K5900 Constipation, unspecified: Secondary | ICD-10-CM | POA: Diagnosis not present

## 2019-09-01 DIAGNOSIS — R1012 Left upper quadrant pain: Secondary | ICD-10-CM

## 2019-09-01 DIAGNOSIS — R110 Nausea: Secondary | ICD-10-CM | POA: Diagnosis not present

## 2019-09-01 LAB — TSH: TSH: 0.93 u[IU]/mL (ref 0.35–4.50)

## 2019-09-01 LAB — C-REACTIVE PROTEIN: CRP: <1 mg/dL (ref 0.5–20.0)

## 2019-09-01 LAB — SEDIMENTATION RATE: Sed Rate: 6 mm/hr (ref 0–20)

## 2019-09-01 LAB — IGA: IgA: 113 mg/dL (ref 68–378)

## 2019-09-01 MED ORDER — PANTOPRAZOLE SODIUM 40 MG PO TBEC: 40.0000 mg | DELAYED_RELEASE_TABLET | Freq: Every day | ORAL | 5 refills | Status: AC

## 2019-09-01 MED ORDER — PROMETHAZINE HCL 12.5 MG PO TABS: 12.5000 mg | ORAL_TABLET | Freq: Four times a day (QID) | ORAL | 0 refills | Status: DC | PRN

## 2019-09-01 NOTE — Progress Notes (Signed)
Attending Physician's Attestation   I have reviewed the chart.   I agree with the Advanced Practitioner's note, impression, and recommendations with any updates as below. Endoscopic evaluation seems reasonable.  If unremarkable, then cross-sectional imaging would be considered.  If constipation persists may want to consider endoscopic evaluation via colonoscopy but does not seem to be the big issue at this time.   Corliss Parish, MD [facility] Advanced Endoscopy Office # [phone]

## 2019-09-01 NOTE — Progress Notes (Signed)
09/01/2019 Samantha Foster [id number] 1995-02-02   HISTORY OF PRESENT ILLNESS:  This is a pleasant 24 year old female who is new to our office.  She is here today at the request of her PCP, Raliegh Ip, FNP, for evaluation regarding LUQ abdominal pain and nausea.  She tells me that about a year ago she developed symptoms of nausea, left upper quadrant abdominal pain, inability to eat.  Sounds like she had an abdominal ultrasound performed, which she said was done in [place], [place].  She says that she tried changing her diet, cutting out gluten, etc., but it did not really seem to help much.  Some of her symptoms then subsided for a little while ago and recurred again about 2 months ago.  She complains of constant nausea.  She says that she has left upper quadrant abdominal pain that is pretty constant on a daily basis.  She describes it as a dull type pain.  She says that anytime she tries to eat she will take 2-3 bites, but then feels sick so stops eating.  She says Zofran is not helping the nausea.  She admits to occasional heartburn with spicy foods, but nothing on a regular basis.  She says that she takes 1 dose of Advil a couple times per week for headaches.  She denies any family history of any GI issues except she thinks that her father has irritable bowel and is lactose intolerant.  She says that her weight tends to go up and down.  She admits to chronic constipation that has been ongoing, but moving her bowels very infrequent now due to the fact that she is not even tolerating much food by mouth.  Says that she only has a BM once every 2 weeks or so recently.  She had a recent CBC and CMP that were normal.   Past Medical History:  Diagnosis Date  . Abdominal pain 05/2019  . Nausea 05/2019   Past Surgical History:  Procedure Laterality Date  . WISDOM TOOTH EXTRACTION      reports that she has never smoked. She has never used smokeless tobacco. She reports current  alcohol use. She reports that she does not use drugs. family history includes Other in her father and mother. No Known Allergies    Outpatient Encounter Medications as of 09/01/2019  Medication Sig  . cetirizine (ZYRTEC) 10 MG tablet Take 1 tablet (10 mg total) by mouth daily.  . ondansetron (ZOFRAN) 4 MG tablet Take 1 tablet (4 mg total) by mouth every 8 (eight) hours as needed for nausea or vomiting.  . valACYclovir (VALTREX) 500 MG tablet Take 1 tablet (500 mg total) by mouth daily. Can increase to twice a day for 5 days in the event of an outbreak   No facility-administered encounter medications on file as of 09/01/2019.    REVIEW OF SYSTEMS  : All other systems reviewed and negative except where noted in the History of Present Illness.   PHYSICAL EXAM: BP 100/68   Pulse 83   Ht 5' (1.524 m)   Wt 130 lb 4 oz (59.1 kg)   LMP 08/15/2019   BMI 25.44 kg/m  General: Well developed AA female in no acute distress Head: Normocephalic and atraumatic Eyes:  Sclerae anicteric, conjunctiva pink. Ears: Normal auditory acuity Lungs: Clear throughout to auscultation; no increased WOB Heart: Regular rate and rhythm; no M/R/G. Abdomen: Soft, non-distended.  BS present.  Non-tender. Musculoskeletal: Symmetrical with no gross deformities  Skin: No lesions on visible extremities Extremities: No edema  Neurological: Alert oriented x 4, grossly non-focal Psychological:  Alert and cooperative. Normal mood and affect  ASSESSMENT AND PLAN: *Nausea, LUQ abdominal pain, inability to eat:  Has been present for the past 2 months but had similar symptoms last year as well.  Sounds like she just had an ultrasound at another facility.  Recent CBC and CMP normal.  Will plan for EGD with Dr. Meridee Score to rule out ulcer, esophagitis, etc.  Will start pantoprazole 40 mg daily and will give phenergan to use prn since zofran does not help.  Prescriptions sent to pharmacy.  Will check celiac labs and inflammatory  markers as well.  Avoid NSAIDs.  The risks, benefits, and alternatives to EGD were discussed with the patient and she consents to proceed.  *Constipation:  Sounds like she has underlying constipation that certainly is more of an issue now that she is not really eating anything.  Will start Miralax daily.  Will check TSH.   CC:  Kallie Locks, FNP

## 2019-09-01 NOTE — Patient Instructions (Addendum)
If you are age 23 or older, your body mass index should be between 23-30. Your Body mass index is 25.44 kg/m. If this is out of the aforementioned range listed, please consider follow up with your Primary Care Provider.  If you are age 95 or younger, your body mass index should be between 19-25. Your Body mass index is 25.44 kg/m. If this is out of the aformentioned range listed, please consider follow up with your Primary Care Provider.   Your provider has requested that you go to the basement level for lab work before leaving today. Press "B" on the elevator. The lab is located at the first door on the left as you exit the elevator.   We have sent the following medications to your pharmacy for you to pick up at your convenience: Pantoprazole 40 mg daily 30-60 minutes prior to breakfast. Phenergan 12.5 mg every 8 hours as needed.  Start Miralax 1 capful daily.  You have been scheduled for an endoscopy. Please follow written instructions given to you at your visit today. If you use inhalers (even only as needed), please bring them with you on the day of your procedure.  Due to recent changes in healthcare laws, you may see the results of your imaging and laboratory studies on MyChart before your provider has had a chance to review them.  We understand that in some cases there may be results that are confusing or concerning to you. Not all laboratory results come back in the same time frame and the provider may be waiting for multiple results in order to interpret others.  Please give [place] 48 hours in order for your provider to thoroughly review all the results before contacting the office for clarification of your results.

## 2019-09-02 ENCOUNTER — Ambulatory Visit (AMBULATORY_SURGERY_CENTER): Payer: Federal, State, Local not specified - PPO | Admitting: Gastroenterology

## 2019-09-02 ENCOUNTER — Other Ambulatory Visit: Payer: Self-pay

## 2019-09-02 ENCOUNTER — Ambulatory Visit: Payer: Federal, State, Local not specified - PPO | Admitting: Family Medicine

## 2019-09-02 ENCOUNTER — Encounter: Payer: Self-pay | Admitting: Gastroenterology

## 2019-09-02 VITALS — BP 103/65 | HR 67 | Temp 98.0°F | Resp 18 | Ht 60.0 in | Wt 130.0 lb

## 2019-09-02 DIAGNOSIS — R1012 Left upper quadrant pain: Secondary | ICD-10-CM

## 2019-09-02 DIAGNOSIS — R110 Nausea: Secondary | ICD-10-CM | POA: Diagnosis not present

## 2019-09-02 LAB — TISSUE TRANSGLUTAMINASE ABS,IGG,IGA
(tTG) Ab, IgA: 1 U/mL
(tTG) Ab, IgG: 1 U/mL

## 2019-09-02 MED ORDER — SODIUM CHLORIDE 0.9 % IV SOLN: 500.0000 mL | Freq: Once | INTRAVENOUS | Status: DC

## 2019-09-02 NOTE — Op Note (Signed)
[facility] Patient Name: Samantha Foster Procedure Date: 09/02/2019 9:41 AM MRN: [id number] Endoscopist: Justice Britain , MD Age: 24 Referring MD:  Date of Birth: 14-Dec-1995 Gender: Female Account #: [id number] Procedure:                Upper GI endoscopy Indications:              Abdominal pain in the left upper quadrant, Nausea,                            Infrequent NSAID use Medicines:                Monitored Anesthesia Care Procedure:                Pre-Anesthesia Assessment:                           - Prior to the procedure, a History and Physical                            was performed, and patient medications and                            allergies were reviewed. The patient's tolerance of                            previous anesthesia was also reviewed. The risks                            and benefits of the procedure and the sedation                            options and risks were discussed with the patient.                            All questions were answered, and informed consent                            was obtained. Prior Anticoagulants: The patient has                            taken no previous anticoagulant or antiplatelet                            agents. ASA Grade Assessment: II - A patient with                            mild systemic disease. After reviewing the risks                            and benefits, the patient was deemed in                            satisfactory condition to undergo the procedure.  After obtaining informed consent, the endoscope was                            passed under direct vision. Throughout the                            procedure, the patient's blood pressure, pulse, and                            oxygen saturations were monitored continuously. The                            Endoscope was introduced through the mouth, and                            advanced to the second part  of duodenum. The upper                            GI endoscopy was accomplished without difficulty.                            The patient tolerated the procedure. Scope In: Scope Out: Findings:                 No gross lesions were noted in the entire                            esophagus. Biopsies were taken with a cold forceps                            for histology.                           The Z-line was regular and was found 37 cm from the                            incisors.                           Normal mucosa was found in the entire examined                            stomach. Biopsies were taken with a cold forceps                            for histology and Helicobacter pylori testing.                           No gross lesions were noted in the duodenal bulb,                            in the first portion of the duodenum and in the  second portion of the duodenum. Biopsies for                            histology were taken with a cold forceps for                            evaluation of celiac disease and enteropathy rule                            out. Complications:            No immediate complications. Estimated Blood Loss:     Estimated blood loss was minimal. Impression:               - No gross lesions in esophagus. Biopsied. Z-line                            regular, 37 cm from the incisors.                           - Normal mucosa was found in the entire stomach.                            Biopsied.                           - No gross lesions in the duodenal bulb, in the                            first portion of the duodenum and in the second                            portion of the duodenum. Biopsied. Recommendation:           - The patient will be observed post-procedure,                            until all discharge criteria are met.                           - Discharge patient to home.                           - Patient  has a contact number available for                            emergencies. The signs and symptoms of potential                            delayed complications were discussed with the                            patient. Return to normal activities tomorrow.                            Written discharge  instructions were provided to the                            patient.                           - Resume previous diet.                           - Continue present medications.                           - Await pathology results.                           - PPI trial as had been discussed at clinic for                            next 4-6 weeks.                           - Please update PA Zehr or myself in a few weeks to                            see how things are going.                           - Query Carafate use in future as well as potential                            cross-sectional imaging should issues persist.                           - The findings and recommendations were discussed                            with the patient. Justice Britain, MD 09/02/2019 10:02:44 AM

## 2019-09-02 NOTE — Patient Instructions (Signed)
YOU HAD AN ENDOSCOPIC PROCEDURE TODAY AT THE [facility]:   Refer to the procedure report that was given to you for any specific questions about what was found during the examination.  If the procedure report does not answer your questions, please call your gastroenterologist to clarify.  If you requested that your care partner not be given the details of your procedure findings, then the procedure report has been included in a sealed envelope for you to review at your convenience later.  YOU SHOULD EXPECT: Some feelings of bloating in the abdomen. Passage of more gas than usual.  Walking can help get rid of the air that was put into your GI tract during the procedure and reduce the bloating. If you had a lower endoscopy (such as a colonoscopy or flexible sigmoidoscopy) you may notice spotting of blood in your stool or on the toilet paper. If you underwent a bowel prep for your procedure, you may not have a normal bowel movement for a few days.  Please Note:  You might notice some irritation and congestion in your nose or some drainage.  This is from the oxygen used during your procedure.  There is no need for concern and it should clear up in a day or so.  SYMPTOMS TO REPORT IMMEDIATELY:    Following upper endoscopy (EGD)  Vomiting of blood or coffee ground material  New chest pain or pain under the shoulder blades  Painful or persistently difficult swallowing  New shortness of breath  Fever of 100F or higher  Black, tarry-looking stools  For urgent or emergent issues, a gastroenterologist can be reached at any hour by calling [phone]. Do not use MyChart messaging for urgent concerns.    DIET:  We do recommend a small meal at first, but then you may proceed to your regular diet.  Drink plenty of fluids but you should avoid alcoholic beverages for 24 hours.  ACTIVITY:  You should plan to take it easy for the rest of today and you should NOT DRIVE or use heavy machinery  until tomorrow (because of the sedation medicines used during the test).    FOLLOW UP: Our staff will call the number listed on your records 48-72 hours following your procedure to check on you and address any questions or concerns that you may have regarding the information given to you following your procedure. If we do not reach you, we will leave a message.  We will attempt to reach you two times.  During this call, we will ask if you have developed any symptoms of COVID 19. If you develop any symptoms (ie: fever, flu-like symptoms, shortness of breath, cough etc.) before then, please call [phone].  If you test positive for Covid 19 in the 2 weeks post procedure, please call and report this information to us.    If any biopsies were taken you will be contacted by phone or by letter within the next 1-3 weeks.  Please call us at [phone] if you have not heard about the biopsies in 3 weeks.    SIGNATURES/CONFIDENTIALITY: You and/or your care partner have signed paperwork which will be entered into your electronic medical record.  These signatures attest to the fact that that the information above on your After Visit Summary has been reviewed and is understood.  Full responsibility of the confidentiality of this discharge information lies with you and/or your care-partner. 

## 2019-09-02 NOTE — Progress Notes (Signed)
VS by CW  Pt's states no medical or surgical changes since previsit or office visit.  

## 2019-09-02 NOTE — Progress Notes (Signed)
PT taken to PACU. Monitors in place. VSS. Report given to RN. 

## 2019-09-02 NOTE — Progress Notes (Signed)
Called to room to assist during endoscopic procedure.  Patient ID and intended procedure confirmed with present staff. Received instructions for my participation in the procedure from the performing physician.  

## 2019-09-06 ENCOUNTER — Telehealth: Payer: Self-pay

## 2019-09-06 NOTE — Telephone Encounter (Signed)
°  Follow up Call-  Call back number 09/02/2019  Post procedure Call Back phone  # [phone]  Permission to leave phone message Yes  Some recent data might be hidden     Patient questions:  Do you have a fever, pain , or abdominal swelling? No. Pain Score  0 *  Have you tolerated food without any problems? Yes.    Have you been able to return to your normal activities? Yes.    Do you have any questions about your discharge instructions: Diet   No. Medications  No. Follow up visit  No.  Do you have questions or concerns about your Care? No.  Actions: * If pain score is 4 or above: No action needed, pain <4.  1. Have you developed a fever since your procedure? no  2.   Have you had an respiratory symptoms (SOB or cough) since your procedure? no  3.   Have you tested positive for COVID 19 since your procedure no  4.   Have you had any family members/close contacts diagnosed with the COVID 19 since your procedure?  no   If yes to any of these questions please route to Laverna Peace, RN and Karlton Lemon, RN

## 2019-09-15 ENCOUNTER — Encounter: Payer: Self-pay | Admitting: Gastroenterology

## 2019-10-19 ENCOUNTER — Encounter (HOSPITAL_COMMUNITY): Payer: Self-pay | Admitting: Emergency Medicine

## 2019-10-19 ENCOUNTER — Ambulatory Visit (HOSPITAL_COMMUNITY)
Admission: EM | Admit: 2019-10-19 | Discharge: 2019-10-19 | Disposition: A | Discharge status: Home / Self Care | Payer: Federal, State, Local not specified - PPO | Attending: Family Medicine | Admitting: Family Medicine

## 2019-10-19 ENCOUNTER — Other Ambulatory Visit: Payer: Self-pay

## 2019-10-19 DIAGNOSIS — Z113 Encounter for screening for infections with a predominantly sexual mode of transmission: Secondary | ICD-10-CM | POA: Insufficient documentation

## 2019-10-19 DIAGNOSIS — N760 Acute vaginitis: Secondary | ICD-10-CM | POA: Diagnosis not present

## 2019-10-19 DIAGNOSIS — N939 Abnormal uterine and vaginal bleeding, unspecified: Secondary | ICD-10-CM | POA: Insufficient documentation

## 2019-10-19 DIAGNOSIS — Z79899 Other long term (current) drug therapy: Secondary | ICD-10-CM | POA: Insufficient documentation

## 2019-10-19 MED ORDER — FLUCONAZOLE 150 MG PO TABS: 150.0000 mg | ORAL_TABLET | Freq: Every day | ORAL | 0 refills | Status: DC

## 2019-10-19 MED ORDER — NYSTATIN-TRIAMCINOLONE 100000-0.1 UNIT/GM-% EX CREA: TOPICAL_CREAM | CUTANEOUS | 0 refills | Status: DC

## 2019-10-19 NOTE — Discharge Instructions (Signed)
Medicine as prescribed.  Sending swab for testing. You can check my chart for results.

## 2019-10-19 NOTE — ED Provider Notes (Signed)
[facility]    CSN: [id number] Arrival date & time: 10/19/19  5638      History   Chief Complaint Chief Complaint  Patient presents with  . Vaginal Bleeding    and irritation    HPI Samantha Foster is a 24 y.o. female.   Patient is a 24 year old female presents today with vaginal itching, irritation and blood-tinged discharge this is been present for 3 days.  Denies any associate abdominal pain, back pain or fevers.  No dysuria, hematuria frequency. Patient's last menstrual period was 10/05/2019.      Past Medical History:  Diagnosis Date  . Abdominal pain 05/2019  . Nausea 05/2019    Patient Active Problem List   Diagnosis Date Noted  . Generalized abdominal pain 07/22/2019  . Vaginal yeast infection 06/29/2019  . Screening examination for STD (sexually transmitted disease) 06/29/2019  . HSV (herpes simplex virus) infection 02/02/2019  . Vaginal irritation 09/22/2018  . Well woman exam with routine gynecological exam 09/22/2018  . Uses oral contraceptives as primary birth control method 09/01/2018  . History of rectal bleeding 03/23/2018  . LUQ abdominal pain 03/23/2018  . Nausea without vomiting 03/23/2018  . Diarrhea 03/23/2018  . Constipation 03/23/2018    Past Surgical History:  Procedure Laterality Date  . WISDOM TOOTH EXTRACTION      OB History    Gravida  0   Para      Term      Preterm      AB      Living        SAB      TAB      Ectopic      Multiple      Live Births               Home Medications    Prior to Admission medications   Medication Sig Start Date End Date Taking? Authorizing Provider  cetirizine (ZYRTEC) 10 MG tablet Take 1 tablet (10 mg total) by mouth daily. 08/24/19   Georgetta Haber, NP  fluconazole (DIFLUCAN) 150 MG tablet Take 1 tablet (150 mg total) by mouth daily. 10/19/19   Janace Aris, NP  nystatin-triamcinolone (MYCOLOG II) cream Apply to affected area daily 10/19/19   Dahlia Byes  A, NP  ondansetron (ZOFRAN) 4 MG tablet Take 1 tablet (4 mg total) by mouth every 8 (eight) hours as needed for nausea or vomiting. 05/30/19   Kallie Locks, FNP  pantoprazole (PROTONIX) 40 MG tablet Take 1 tablet (40 mg total) by mouth daily. 09/01/19   Zehr, Princella Pellegrini, PA-C  promethazine (PHENERGAN) 12.5 MG tablet Take 1 tablet (12.5 mg total) by mouth every 6 (six) hours as needed for nausea or vomiting. 09/01/19   Zehr, Princella Pellegrini, PA-C  valACYclovir (VALTREX) 500 MG tablet Take 1 tablet (500 mg total) by mouth daily. Can increase to twice a day for 5 days in the event of an outbreak 02/02/19   Sharyon Cable, CNM    Family History Family History  Problem Relation Age of Onset  . Other Mother        Healthy  . Other Father        Healthy   . Colon cancer Neg Hx   . Esophageal cancer Neg Hx   . Stomach cancer Neg Hx   . Rectal cancer Neg Hx     Social History Social History   Tobacco Use  . Smoking status: Never Smoker  .  Smokeless tobacco: Never Used  Vaping Use  . Vaping Use: Never used  Substance Use Topics  . Alcohol use: Yes  . Drug use: No     Allergies   Patient has no known allergies.   Review of Systems Review of Systems   Physical Exam Triage Vital Signs ED Triage Vitals  Enc Vitals Group     BP 10/19/19 0920 109/80     Pulse Rate 10/19/19 0920 (!) 101     Resp 10/19/19 0920 16     Temp 10/19/19 0920 98.2 F (36.8 C)     Temp Source 10/19/19 0920 Oral     SpO2 10/19/19 0920 98 %     Weight --      Height --      Head Circumference --      Peak Flow --      Pain Score 10/19/19 0919 0     Pain Loc --      Pain Edu? --      Excl. in GC? --    No data found.  Updated Vital Signs BP 109/80 (BP Location: Right Arm)   Pulse (!) 101   Temp 98.2 F (36.8 C) (Oral)   Resp 16   LMP 10/05/2019   SpO2 98%   Visual Acuity Right Eye Distance:   Left Eye Distance:   Bilateral Distance:    Right Eye Near:   Left Eye Near:    Bilateral  Near:     Physical Exam Vitals and nursing note reviewed.  Constitutional:      General: She is not in acute distress.    Appearance: Normal appearance. She is not ill-appearing, toxic-appearing or diaphoretic.  HENT:     Head: Normocephalic.     Nose: Nose normal.  Eyes:     Conjunctiva/sclera: Conjunctivae normal.  Pulmonary:     Effort: Pulmonary effort is normal.  Musculoskeletal:        General: Normal range of motion.     Cervical back: Normal range of motion.  Skin:    General: Skin is warm and dry.     Findings: No rash.  Neurological:     Mental Status: She is alert.  Psychiatric:        Mood and Affect: Mood normal.      UC Treatments / Results  Labs (all labs ordered are listed, but only abnormal results are displayed) Labs Reviewed  CERVICOVAGINAL ANCILLARY ONLY    EKG   Radiology No results found.  Procedures Procedures (including critical care time)  Medications Ordered in UC Medications - No data to display  Initial Impression / Assessment and Plan / UC Course  I have reviewed the triage vital signs and the nursing notes.  Pertinent labs & imaging results that were available during my care of the patient were reviewed by me and considered in my medical decision making (see chart for details).     Vaginitis Treated for vaginal yeast infection based on symptoms and history. Swab sent for testing. Final Clinical Impressions(s) / UC Diagnoses   Final diagnoses:  Vaginitis and vulvovaginitis     Discharge Instructions     Medicine as prescribed.  Sending swab for testing. You can check my chart for results.     ED Prescriptions    Medication Sig Dispense Auth. Provider   fluconazole (DIFLUCAN) 150 MG tablet Take 1 tablet (150 mg total) by mouth daily. 2 tablet Marquarius Lofton A, NP   nystatin-triamcinolone (MYCOLOG II)  cream Apply to affected area daily 15 g Aloys Hupfer A, NP     PDMP not reviewed this encounter.   Janace Aris,  NP 10/19/19 1404

## 2019-10-19 NOTE — ED Triage Notes (Signed)
Pt presents with vaginal itching, bloody discharge, and irritation xs 3 days.

## 2019-10-20 LAB — CERVICOVAGINAL ANCILLARY ONLY
Bacterial Vaginitis (gardnerella): NEGATIVE
Candida Glabrata: NEGATIVE
Candida Vaginitis: POSITIVE — AB
Chlamydia: NEGATIVE
Comment: NEGATIVE
Comment: NEGATIVE
Comment: NEGATIVE
Comment: NEGATIVE
Comment: NEGATIVE
Comment: NORMAL
Neisseria Gonorrhea: NEGATIVE
Trichomonas: NEGATIVE

## 2019-10-27 ENCOUNTER — Other Ambulatory Visit: Payer: Federal, State, Local not specified - PPO

## 2019-10-27 DIAGNOSIS — Z20822 Contact with and (suspected) exposure to covid-19: Secondary | ICD-10-CM | POA: Diagnosis not present

## 2019-10-28 LAB — NOVEL CORONAVIRUS, NAA: SARS-CoV-2, NAA: NOT DETECTED

## 2019-10-28 LAB — SARS-COV-2, NAA 2 DAY TAT

## 2019-11-07 ENCOUNTER — Other Ambulatory Visit: Payer: Federal, State, Local not specified - PPO

## 2019-11-07 ENCOUNTER — Other Ambulatory Visit: Payer: Self-pay

## 2019-11-07 DIAGNOSIS — Z20822 Contact with and (suspected) exposure to covid-19: Secondary | ICD-10-CM

## 2019-11-08 LAB — SARS-COV-2, NAA 2 DAY TAT

## 2019-11-08 LAB — NOVEL CORONAVIRUS, NAA: SARS-CoV-2, NAA: NOT DETECTED

## 2020-01-04 DIAGNOSIS — Z20828 Contact with and (suspected) exposure to other viral communicable diseases: Secondary | ICD-10-CM | POA: Diagnosis not present

## 2020-01-04 DIAGNOSIS — Z1159 Encounter for screening for other viral diseases: Secondary | ICD-10-CM | POA: Diagnosis not present

## 2020-01-13 ENCOUNTER — Ambulatory Visit: Payer: Federal, State, Local not specified - PPO | Admitting: Family Medicine

## 2020-02-02 ENCOUNTER — Ambulatory Visit: Payer: Federal, State, Local not specified - PPO | Admitting: Obstetrics and Gynecology

## 2020-02-16 ENCOUNTER — Other Ambulatory Visit (HOSPITAL_COMMUNITY)
Admission: RE | Admit: 2020-02-16 | Discharge: 2020-02-16 | Disposition: A | Discharge status: Home / Self Care | Payer: Federal, State, Local not specified - PPO | Source: Ambulatory Visit | Attending: Obstetrics and Gynecology | Admitting: Obstetrics and Gynecology

## 2020-02-16 ENCOUNTER — Ambulatory Visit (INDEPENDENT_AMBULATORY_CARE_PROVIDER_SITE_OTHER): Payer: Federal, State, Local not specified - PPO | Admitting: Obstetrics and Gynecology

## 2020-02-16 ENCOUNTER — Other Ambulatory Visit: Payer: Self-pay

## 2020-02-16 ENCOUNTER — Encounter: Payer: Self-pay | Admitting: Obstetrics and Gynecology

## 2020-02-16 VITALS — BP 107/67 | HR 65 | Resp 16 | Ht 65.0 in | Wt 136.0 lb

## 2020-02-16 DIAGNOSIS — Z01411 Encounter for gynecological examination (general) (routine) with abnormal findings: Secondary | ICD-10-CM | POA: Diagnosis not present

## 2020-02-16 DIAGNOSIS — Z01419 Encounter for gynecological examination (general) (routine) without abnormal findings: Secondary | ICD-10-CM | POA: Diagnosis not present

## 2020-02-16 DIAGNOSIS — Z113 Encounter for screening for infections with a predominantly sexual mode of transmission: Secondary | ICD-10-CM | POA: Diagnosis not present

## 2020-02-16 DIAGNOSIS — B9689 Other specified bacterial agents as the cause of diseases classified elsewhere: Secondary | ICD-10-CM | POA: Insufficient documentation

## 2020-02-16 DIAGNOSIS — N760 Acute vaginitis: Secondary | ICD-10-CM | POA: Diagnosis not present

## 2020-02-16 NOTE — Progress Notes (Signed)
  Subjective:     Samantha Foster is a 25 y.o. female 0 with LMP 02/02/20 who is here for a comprehensive physical exam. The patient reports no problems. She reports a monthly period lasting 4-5 days. She is not currently sexually active. She is no longer on birth control pills. Patient reports the presence of a white discharge with occasional odor. She denies any pelvic pain. Patient desires to be tested for all STDs. Patient is without any other complaints  Past Medical History:  Diagnosis Date  . Abdominal pain 05/2019  . Nausea 05/2019   Past Surgical History:  Procedure Laterality Date  . WISDOM TOOTH EXTRACTION     Family History  Problem Relation Age of Onset  . Other Mother        Healthy  . Other Father        Healthy   . Colon cancer Neg Hx   . Esophageal cancer Neg Hx   . Stomach cancer Neg Hx   . Rectal cancer Neg Hx     Social History   Socioeconomic History  . Marital status: Significant Other    Spouse name: Not on file  . Number of children: Not on file  . Years of education: Not on file  . Highest education level: Not on file  Occupational History  . Not on file  Tobacco Use  . Smoking status: Never Smoker  . Smokeless tobacco: Never Used  Vaping Use  . Vaping Use: Never used  Substance and Sexual Activity  . Alcohol use: Yes  . Drug use: No  . Sexual activity: Yes    Birth control/protection: Pill  Other Topics Concern  . Not on file  Social History Narrative  . Not on file   Social Determinants of Health   Financial Resource Strain: Not on file  Food Insecurity: Not on file  Transportation Needs: Not on file  Physical Activity: Not on file  Stress: Not on file  Social Connections: Not on file  Intimate Partner Violence: Not on file   Health Maintenance  Topic Date Due  . COVID-19 Vaccine (1) Never done  . TETANUS/TDAP  Never done  . INFLUENZA VACCINE  Never done  . PAP-Cervical Cytology Screening  09/21/2021  . PAP SMEAR-Modifier   09/21/2021  . Hepatitis C Screening  Completed  . HIV Screening  Completed       Review of Systems Pertinent items noted in HPI and remainder of comprehensive ROS otherwise negative.   Objective:      GENERAL: Well-developed, well-nourished female in no acute distress.  HEENT: Normocephalic, atraumatic. Sclerae anicteric.  NECK: Supple. Normal thyroid.  LUNGS: Clear to auscultation bilaterally.  HEART: Regular rate and rhythm. BREASTS: Symmetric in size. No palpable masses or lymphadenopathy, skin changes, or nipple drainage. ABDOMEN: Soft, nontender, nondistended. No organomegaly. PELVIC: Normal external female genitalia. Vagina is pink and rugated.  Normal discharge. Normal appearing cervix. Uterus is normal in size.  No adnexal mass or tenderness. EXTREMITIES: No cyanosis, clubbing, or edema, 2+ distal pulses.    Assessment:    Healthy female exam.      Plan:    Pap smear collected Vaginal swab collected  STI screening per patient request Patient will be contacted with abnormal results See After Visit Summary for Counseling Recommendations

## 2020-02-17 LAB — CERVICOVAGINAL ANCILLARY ONLY
Bacterial Vaginitis (gardnerella): POSITIVE — AB
Candida Glabrata: NEGATIVE
Candida Vaginitis: NEGATIVE
Chlamydia: NEGATIVE
Comment: NEGATIVE
Comment: NEGATIVE
Comment: NEGATIVE
Comment: NEGATIVE
Comment: NEGATIVE
Comment: NORMAL
Neisseria Gonorrhea: NEGATIVE
Trichomonas: NEGATIVE

## 2020-02-17 LAB — HIV ANTIBODY (ROUTINE TESTING W REFLEX): HIV 1&2 Ab, 4th Generation: NONREACTIVE

## 2020-02-20 LAB — CYTOLOGY - PAP
Diagnosis: NEGATIVE
Diagnosis: REACTIVE

## 2020-02-20 MED ORDER — METRONIDAZOLE 500 MG PO TABS: 500.0000 mg | ORAL_TABLET | Freq: Two times a day (BID) | ORAL | 0 refills | Status: AC

## 2020-02-20 NOTE — Addendum Note (Signed)
Addended by: Catalina Antigua on: 02/20/2020 08:27 AM   Modules accepted: Orders

## 2020-03-28 DIAGNOSIS — R109 Unspecified abdominal pain: Secondary | ICD-10-CM | POA: Diagnosis not present

## 2020-03-28 DIAGNOSIS — R110 Nausea: Secondary | ICD-10-CM | POA: Diagnosis not present

## 2020-03-28 DIAGNOSIS — R1032 Left lower quadrant pain: Secondary | ICD-10-CM | POA: Diagnosis not present

## 2020-03-28 DIAGNOSIS — K5900 Constipation, unspecified: Secondary | ICD-10-CM | POA: Diagnosis not present

## 2020-03-28 DIAGNOSIS — R1013 Epigastric pain: Secondary | ICD-10-CM | POA: Diagnosis not present

## 2020-03-31 DIAGNOSIS — R140 Abdominal distension (gaseous): Secondary | ICD-10-CM | POA: Diagnosis not present

## 2020-03-31 DIAGNOSIS — K5900 Constipation, unspecified: Secondary | ICD-10-CM | POA: Diagnosis not present

## 2020-03-31 DIAGNOSIS — R1032 Left lower quadrant pain: Secondary | ICD-10-CM | POA: Diagnosis not present

## 2020-04-17 DIAGNOSIS — G8929 Other chronic pain: Secondary | ICD-10-CM | POA: Diagnosis not present

## 2020-04-17 DIAGNOSIS — R109 Unspecified abdominal pain: Secondary | ICD-10-CM | POA: Diagnosis not present

## 2020-05-17 ENCOUNTER — Other Ambulatory Visit: Payer: Self-pay

## 2020-06-29 DIAGNOSIS — N898 Other specified noninflammatory disorders of vagina: Secondary | ICD-10-CM | POA: Diagnosis not present

## 2020-06-29 DIAGNOSIS — N760 Acute vaginitis: Secondary | ICD-10-CM | POA: Diagnosis not present

## 2020-06-29 DIAGNOSIS — N915 Oligomenorrhea, unspecified: Secondary | ICD-10-CM | POA: Diagnosis not present

## 2020-06-29 DIAGNOSIS — Z113 Encounter for screening for infections with a predominantly sexual mode of transmission: Secondary | ICD-10-CM | POA: Diagnosis not present

## 2020-07-12 DIAGNOSIS — M9905 Segmental and somatic dysfunction of pelvic region: Secondary | ICD-10-CM | POA: Diagnosis not present

## 2020-07-12 DIAGNOSIS — S39012A Strain of muscle, fascia and tendon of lower back, initial encounter: Secondary | ICD-10-CM | POA: Diagnosis not present

## 2020-07-12 DIAGNOSIS — M9902 Segmental and somatic dysfunction of thoracic region: Secondary | ICD-10-CM | POA: Diagnosis not present

## 2020-07-12 DIAGNOSIS — M9903 Segmental and somatic dysfunction of lumbar region: Secondary | ICD-10-CM | POA: Diagnosis not present

## 2020-07-12 DIAGNOSIS — M7918 Myalgia, other site: Secondary | ICD-10-CM | POA: Diagnosis not present

## 2020-08-12 DIAGNOSIS — Z20822 Contact with and (suspected) exposure to covid-19: Secondary | ICD-10-CM | POA: Diagnosis not present

## 2020-09-11 DIAGNOSIS — Z20822 Contact with and (suspected) exposure to covid-19: Secondary | ICD-10-CM | POA: Diagnosis not present

## 2020-09-26 DIAGNOSIS — L710 Perioral dermatitis: Secondary | ICD-10-CM | POA: Diagnosis not present

## 2021-02-10 IMAGING — DX DG CHEST 2V
2 series · 2 of 2 positions shown · non-contrast
Comparison: None.

CLINICAL DATA: Chest pain

EXAM:
CHEST - 2 VIEW

[chest pa]
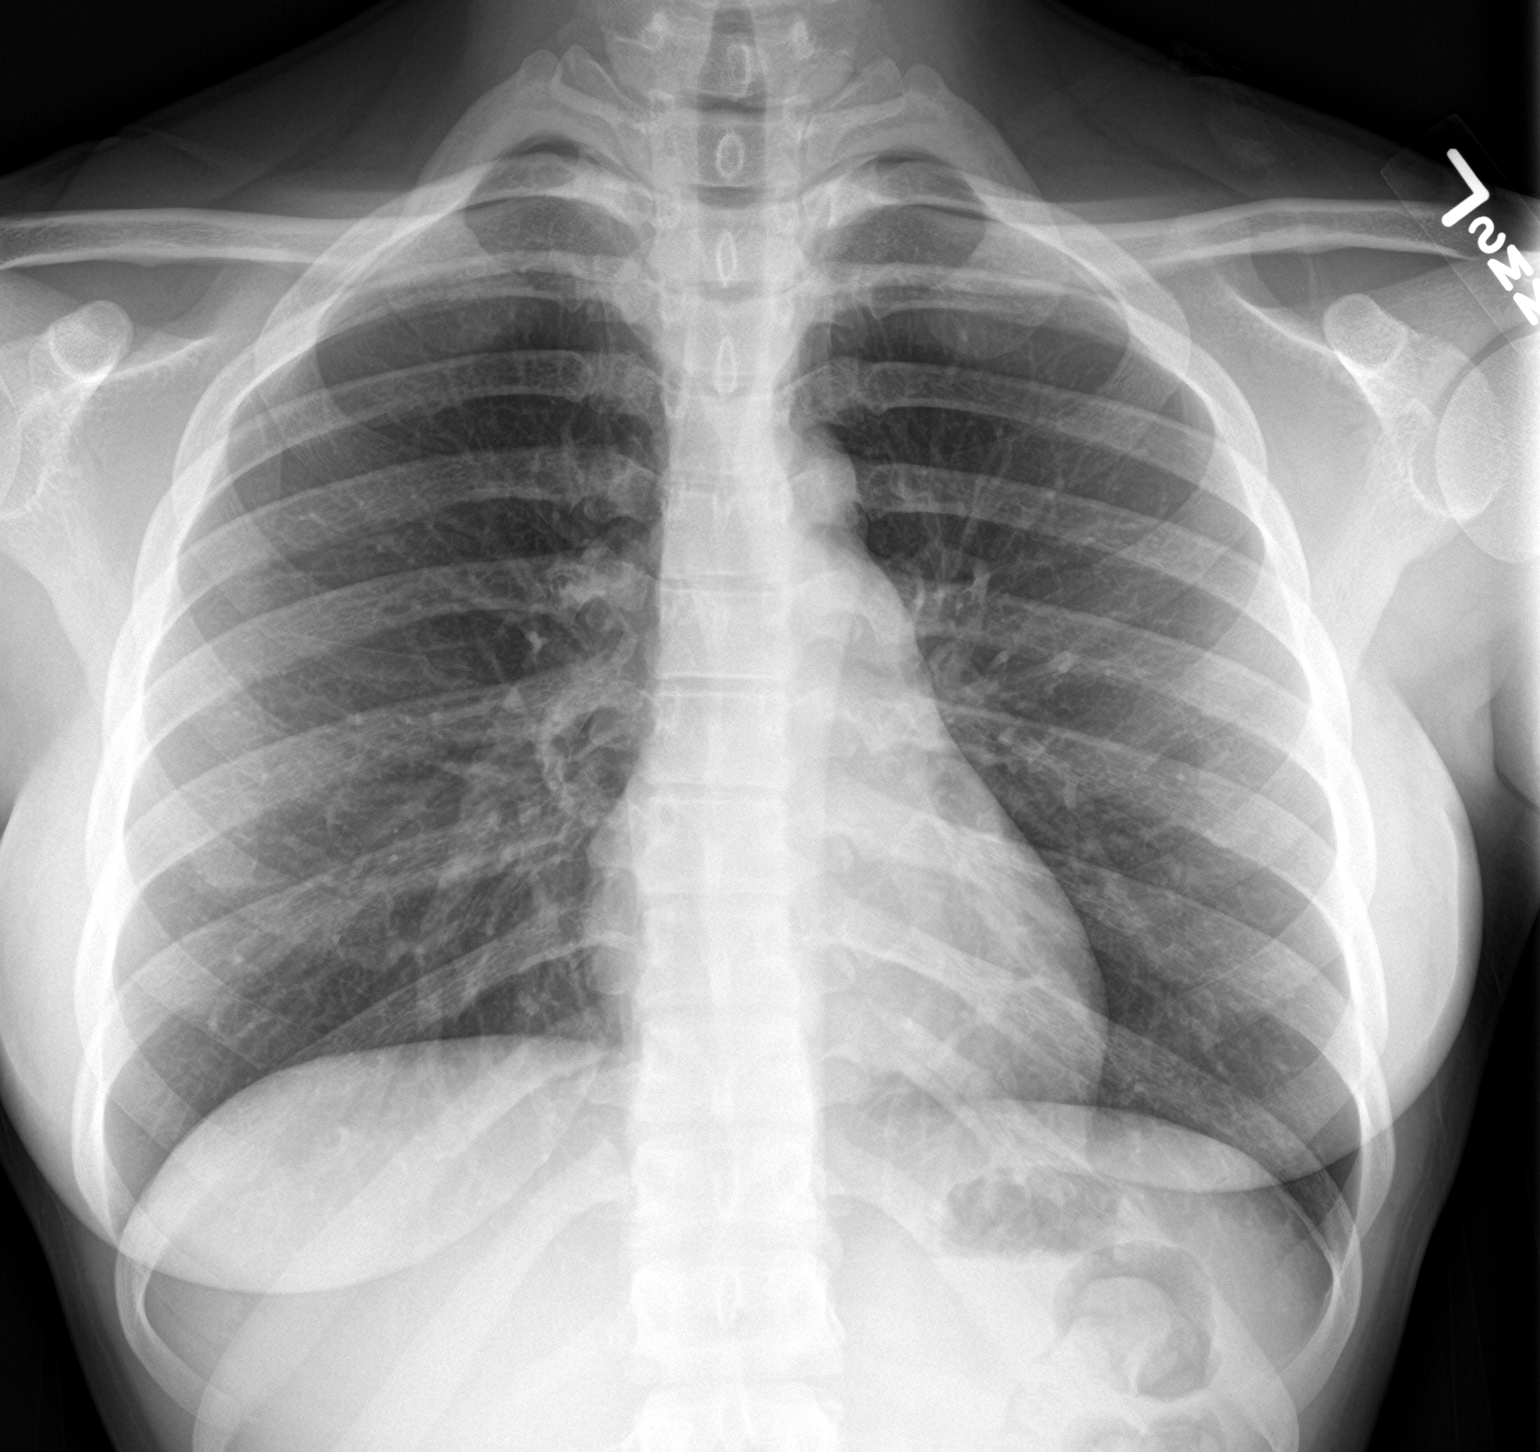

[chest lat]
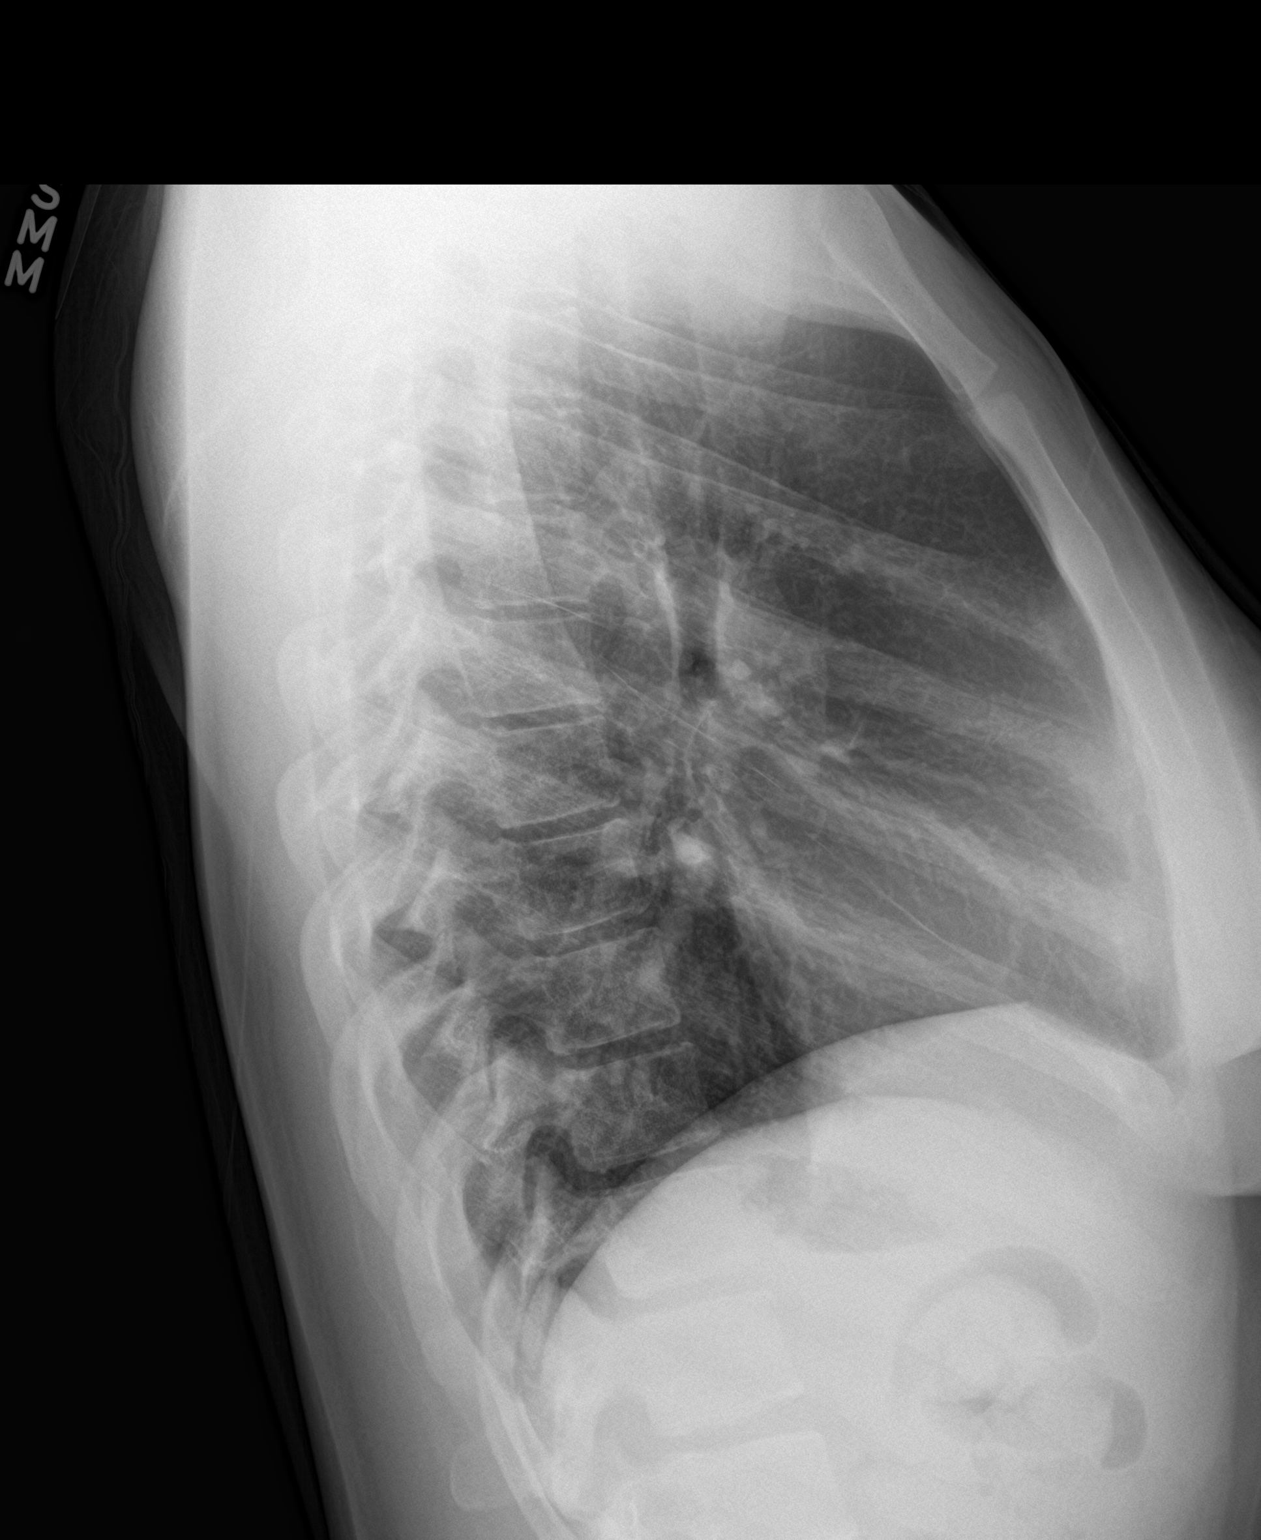

[2 of 2 positions shown; findings below may reference images not displayed]

FINDINGS: Lungs are clear. Heart size and pulmonary vascularity are normal. No
adenopathy. No pneumothorax. No bone lesions.
IMPRESSION: No abnormality noted.

## 2021-04-08 DIAGNOSIS — R110 Nausea: Secondary | ICD-10-CM | POA: Diagnosis not present

## 2021-04-08 DIAGNOSIS — H9193 Unspecified hearing loss, bilateral: Secondary | ICD-10-CM | POA: Diagnosis not present

## 2021-04-08 DIAGNOSIS — K5909 Other constipation: Secondary | ICD-10-CM | POA: Diagnosis not present

## 2021-05-10 DIAGNOSIS — K5900 Constipation, unspecified: Secondary | ICD-10-CM | POA: Diagnosis not present

## 2021-05-13 DIAGNOSIS — K5909 Other constipation: Secondary | ICD-10-CM | POA: Diagnosis not present

## 2021-05-13 DIAGNOSIS — R140 Abdominal distension (gaseous): Secondary | ICD-10-CM | POA: Diagnosis not present

## 2021-05-13 DIAGNOSIS — R110 Nausea: Secondary | ICD-10-CM | POA: Diagnosis not present

## 2021-05-16 DIAGNOSIS — H93293 Other abnormal auditory perceptions, bilateral: Secondary | ICD-10-CM | POA: Diagnosis not present

## 2021-05-16 DIAGNOSIS — H900 Conductive hearing loss, bilateral: Secondary | ICD-10-CM | POA: Diagnosis not present

## 2021-05-28 DIAGNOSIS — K5900 Constipation, unspecified: Secondary | ICD-10-CM | POA: Diagnosis not present

## 2021-05-28 DIAGNOSIS — R110 Nausea: Secondary | ICD-10-CM | POA: Diagnosis not present

## 2021-05-28 DIAGNOSIS — K644 Residual hemorrhoidal skin tags: Secondary | ICD-10-CM | POA: Diagnosis not present

## 2021-06-12 DIAGNOSIS — K5909 Other constipation: Secondary | ICD-10-CM | POA: Diagnosis not present

## 2021-06-12 DIAGNOSIS — R110 Nausea: Secondary | ICD-10-CM | POA: Diagnosis not present

## 2021-06-12 DIAGNOSIS — K644 Residual hemorrhoidal skin tags: Secondary | ICD-10-CM | POA: Diagnosis not present

## 2021-06-12 DIAGNOSIS — K5900 Constipation, unspecified: Secondary | ICD-10-CM | POA: Diagnosis not present

## 2021-06-17 DIAGNOSIS — N926 Irregular menstruation, unspecified: Secondary | ICD-10-CM | POA: Diagnosis not present

## 2021-06-17 DIAGNOSIS — K5909 Other constipation: Secondary | ICD-10-CM | POA: Diagnosis not present

## 2021-06-17 DIAGNOSIS — N946 Dysmenorrhea, unspecified: Secondary | ICD-10-CM | POA: Diagnosis not present

## 2021-06-18 DIAGNOSIS — N926 Irregular menstruation, unspecified: Secondary | ICD-10-CM | POA: Diagnosis not present

## 2021-07-02 DIAGNOSIS — K5900 Constipation, unspecified: Secondary | ICD-10-CM | POA: Diagnosis not present

## 2021-07-02 DIAGNOSIS — R110 Nausea: Secondary | ICD-10-CM | POA: Diagnosis not present

## 2021-07-02 DIAGNOSIS — R109 Unspecified abdominal pain: Secondary | ICD-10-CM | POA: Diagnosis not present

## 2021-07-03 DIAGNOSIS — N946 Dysmenorrhea, unspecified: Secondary | ICD-10-CM | POA: Diagnosis not present

## 2021-07-03 DIAGNOSIS — N921 Excessive and frequent menstruation with irregular cycle: Secondary | ICD-10-CM | POA: Diagnosis not present

## 2021-07-03 DIAGNOSIS — Z01411 Encounter for gynecological examination (general) (routine) with abnormal findings: Secondary | ICD-10-CM | POA: Diagnosis not present

## 2021-07-03 DIAGNOSIS — N939 Abnormal uterine and vaginal bleeding, unspecified: Secondary | ICD-10-CM | POA: Diagnosis not present

## 2021-07-03 DIAGNOSIS — Z124 Encounter for screening for malignant neoplasm of cervix: Secondary | ICD-10-CM | POA: Diagnosis not present

## 2021-07-03 DIAGNOSIS — R102 Pelvic and perineal pain: Secondary | ICD-10-CM | POA: Diagnosis not present

## 2021-07-03 DIAGNOSIS — Z113 Encounter for screening for infections with a predominantly sexual mode of transmission: Secondary | ICD-10-CM | POA: Diagnosis not present

## 2021-07-08 DIAGNOSIS — N9489 Other specified conditions associated with female genital organs and menstrual cycle: Secondary | ICD-10-CM | POA: Diagnosis not present

## 2021-07-08 DIAGNOSIS — R102 Pelvic and perineal pain: Secondary | ICD-10-CM | POA: Diagnosis not present

## 2021-07-08 DIAGNOSIS — Z3049 Encounter for surveillance of other contraceptives: Secondary | ICD-10-CM | POA: Diagnosis not present

## 2021-07-08 DIAGNOSIS — N946 Dysmenorrhea, unspecified: Secondary | ICD-10-CM | POA: Diagnosis not present

## 2021-08-01 DIAGNOSIS — K5909 Other constipation: Secondary | ICD-10-CM | POA: Diagnosis not present

## 2021-08-13 DIAGNOSIS — U071 COVID-19: Secondary | ICD-10-CM | POA: Diagnosis not present

## 2021-08-13 DIAGNOSIS — J029 Acute pharyngitis, unspecified: Secondary | ICD-10-CM | POA: Diagnosis not present

## 2021-09-02 DIAGNOSIS — R110 Nausea: Secondary | ICD-10-CM | POA: Diagnosis not present

## 2021-09-02 DIAGNOSIS — K5900 Constipation, unspecified: Secondary | ICD-10-CM | POA: Diagnosis not present

## 2021-09-02 DIAGNOSIS — R109 Unspecified abdominal pain: Secondary | ICD-10-CM | POA: Diagnosis not present

## 2021-10-09 DIAGNOSIS — R102 Pelvic and perineal pain: Secondary | ICD-10-CM | POA: Diagnosis not present

## 2021-10-15 DIAGNOSIS — R188 Other ascites: Secondary | ICD-10-CM | POA: Diagnosis not present

## 2021-10-15 DIAGNOSIS — N946 Dysmenorrhea, unspecified: Secondary | ICD-10-CM | POA: Diagnosis not present

## 2021-10-24 DIAGNOSIS — K5900 Constipation, unspecified: Secondary | ICD-10-CM | POA: Diagnosis not present

## 2021-10-24 DIAGNOSIS — R110 Nausea: Secondary | ICD-10-CM | POA: Diagnosis not present

## 2021-10-24 DIAGNOSIS — R109 Unspecified abdominal pain: Secondary | ICD-10-CM | POA: Diagnosis not present

## 2021-11-05 DIAGNOSIS — K5900 Constipation, unspecified: Secondary | ICD-10-CM | POA: Diagnosis not present

## 2021-11-05 DIAGNOSIS — K5901 Slow transit constipation: Secondary | ICD-10-CM | POA: Diagnosis not present

## 2021-11-05 DIAGNOSIS — M62838 Other muscle spasm: Secondary | ICD-10-CM | POA: Diagnosis not present

## 2021-11-20 DIAGNOSIS — K5901 Slow transit constipation: Secondary | ICD-10-CM | POA: Diagnosis not present

## 2021-11-20 DIAGNOSIS — K5900 Constipation, unspecified: Secondary | ICD-10-CM | POA: Diagnosis not present

## 2021-11-20 DIAGNOSIS — M62838 Other muscle spasm: Secondary | ICD-10-CM | POA: Diagnosis not present

## 2021-11-26 DIAGNOSIS — K5900 Constipation, unspecified: Secondary | ICD-10-CM | POA: Diagnosis not present

## 2021-11-26 DIAGNOSIS — K5901 Slow transit constipation: Secondary | ICD-10-CM | POA: Diagnosis not present

## 2021-11-26 DIAGNOSIS — M62838 Other muscle spasm: Secondary | ICD-10-CM | POA: Diagnosis not present

## 2021-12-12 DIAGNOSIS — K5900 Constipation, unspecified: Secondary | ICD-10-CM | POA: Diagnosis not present

## 2021-12-12 DIAGNOSIS — K5901 Slow transit constipation: Secondary | ICD-10-CM | POA: Diagnosis not present

## 2021-12-12 DIAGNOSIS — M62838 Other muscle spasm: Secondary | ICD-10-CM | POA: Diagnosis not present

## 2021-12-19 DIAGNOSIS — M62838 Other muscle spasm: Secondary | ICD-10-CM | POA: Diagnosis not present

## 2021-12-19 DIAGNOSIS — K5901 Slow transit constipation: Secondary | ICD-10-CM | POA: Diagnosis not present

## 2021-12-19 DIAGNOSIS — K5900 Constipation, unspecified: Secondary | ICD-10-CM | POA: Diagnosis not present

## 2021-12-24 DIAGNOSIS — Z0000 Encounter for general adult medical examination without abnormal findings: Secondary | ICD-10-CM | POA: Diagnosis not present

## 2021-12-25 DIAGNOSIS — Z0000 Encounter for general adult medical examination without abnormal findings: Secondary | ICD-10-CM | POA: Diagnosis not present

## 2022-01-02 DIAGNOSIS — M62838 Other muscle spasm: Secondary | ICD-10-CM | POA: Diagnosis not present

## 2022-01-02 DIAGNOSIS — K5900 Constipation, unspecified: Secondary | ICD-10-CM | POA: Diagnosis not present

## 2022-01-02 DIAGNOSIS — K5901 Slow transit constipation: Secondary | ICD-10-CM | POA: Diagnosis not present
